# Patient Record
Sex: Female | Born: 1985 | Race: Asian | Hispanic: No | Marital: Married | Smoking: Never smoker
Health system: Southern US, Community
[De-identification: ages and names within clinical notes are randomized; demographics above are authoritative.]

## PROBLEM LIST (undated history)

## (undated) ENCOUNTER — Inpatient Hospital Stay (HOSPITAL_COMMUNITY): Payer: Self-pay

## (undated) DIAGNOSIS — Z789 Other specified health status: Secondary | ICD-10-CM

## (undated) DIAGNOSIS — O139 Gestational [pregnancy-induced] hypertension without significant proteinuria, unspecified trimester: Secondary | ICD-10-CM

## (undated) HISTORY — PX: ABDOMINOPLASTY: SUR9

## (undated) HISTORY — DX: Other specified health status: Z78.9

## (undated) HISTORY — PX: NO PAST SURGERIES: SHX2092

## (undated) HISTORY — DX: Gestational (pregnancy-induced) hypertension without significant proteinuria, unspecified trimester: O13.9

---
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
## 2006-04-20 ENCOUNTER — Other Ambulatory Visit: Admission: RE | Admit: 2006-04-20 | Discharge: 2006-04-20 | Payer: Self-pay | Admitting: Obstetrics and Gynecology

## 2006-05-22 ENCOUNTER — Ambulatory Visit (HOSPITAL_COMMUNITY): Admission: RE | Admit: 2006-05-22 | Discharge: 2006-05-22 | Payer: Self-pay | Admitting: Obstetrics and Gynecology

## 2006-07-12 ENCOUNTER — Inpatient Hospital Stay (HOSPITAL_COMMUNITY): Admission: AD | Admit: 2006-07-12 | Discharge: 2006-07-12 | Payer: Self-pay | Admitting: Obstetrics and Gynecology

## 2006-10-02 ENCOUNTER — Inpatient Hospital Stay (HOSPITAL_COMMUNITY): Admission: AD | Admit: 2006-10-02 | Discharge: 2006-10-02 | Payer: Self-pay | Admitting: Obstetrics and Gynecology

## 2006-10-12 ENCOUNTER — Inpatient Hospital Stay (HOSPITAL_COMMUNITY): Admission: AD | Admit: 2006-10-12 | Discharge: 2006-10-12 | Payer: Self-pay | Admitting: Obstetrics and Gynecology

## 2006-10-14 ENCOUNTER — Encounter (INDEPENDENT_AMBULATORY_CARE_PROVIDER_SITE_OTHER): Payer: Self-pay | Admitting: Specialist

## 2006-10-14 ENCOUNTER — Inpatient Hospital Stay (HOSPITAL_COMMUNITY): Admission: AD | Admit: 2006-10-14 | Discharge: 2006-10-16 | Payer: Self-pay | Admitting: Obstetrics and Gynecology

## 2008-11-22 ENCOUNTER — Emergency Department (HOSPITAL_COMMUNITY): Admission: EM | Admit: 2008-11-22 | Discharge: 2008-11-22 | Payer: Self-pay | Admitting: Emergency Medicine

## 2008-11-23 ENCOUNTER — Inpatient Hospital Stay (HOSPITAL_COMMUNITY): Admission: AD | Admit: 2008-11-23 | Discharge: 2008-11-23 | Payer: Self-pay | Admitting: Obstetrics & Gynecology

## 2008-11-30 ENCOUNTER — Inpatient Hospital Stay (HOSPITAL_COMMUNITY): Admission: AD | Admit: 2008-11-30 | Discharge: 2008-11-30 | Payer: Self-pay | Admitting: Obstetrics & Gynecology

## 2009-12-17 IMAGING — US US OB COMP LESS 14 WK
1 series · 14 of 28 positions shown · non-contrast
Comparison: none

OBSTETRICAL ULTRASOUND:
 This ultrasound exam was performed in the [HOSPITAL] Ultrasound Department.  The OB US report was generated in the AS system, and faxed to the ordering physician.  This report is also available in [REDACTED] PACS.

[Series 1: us ob comp less 14 wks · 35 acquisitions, 14 frames shown]
[im 2/35]
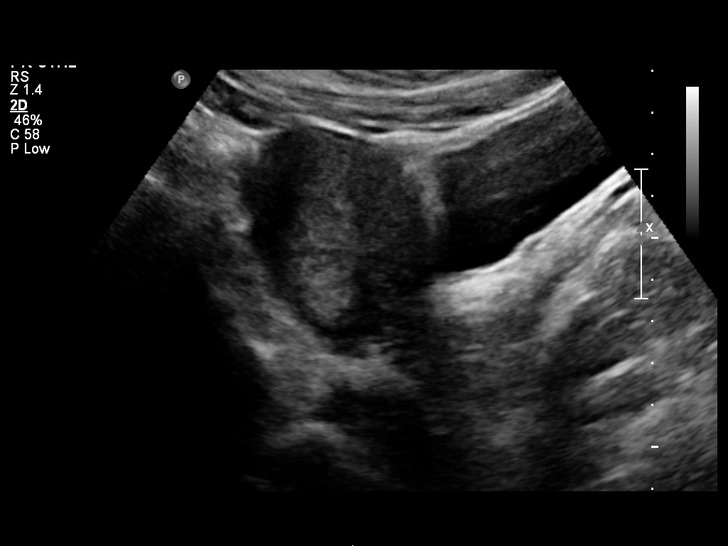
[im 4/35]
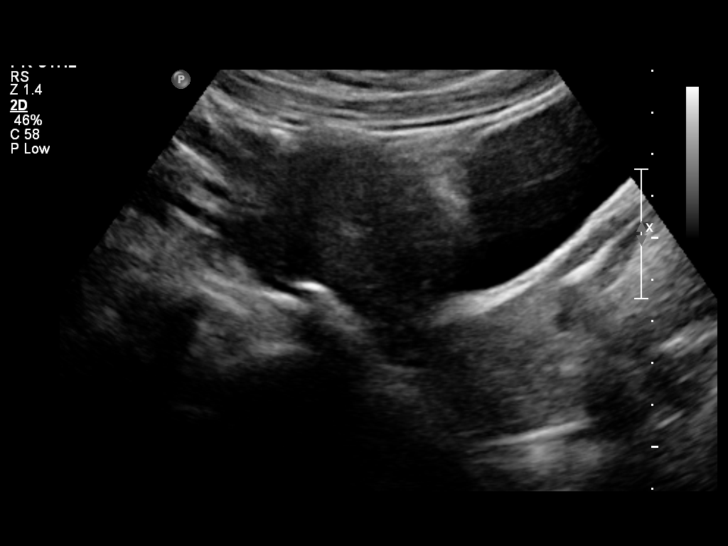
[im 7/35]
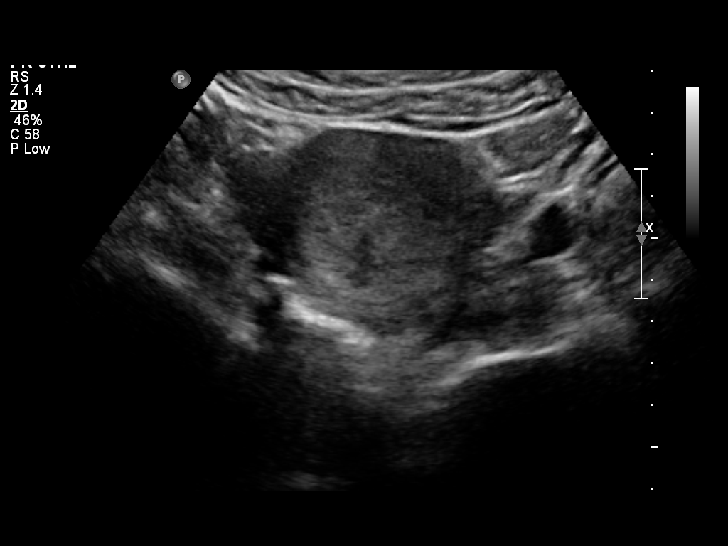
[im 9/35]
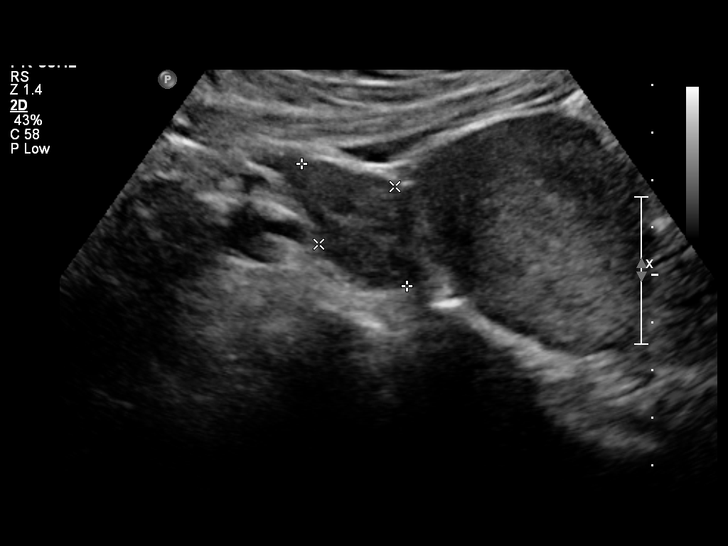
[im 12/35]
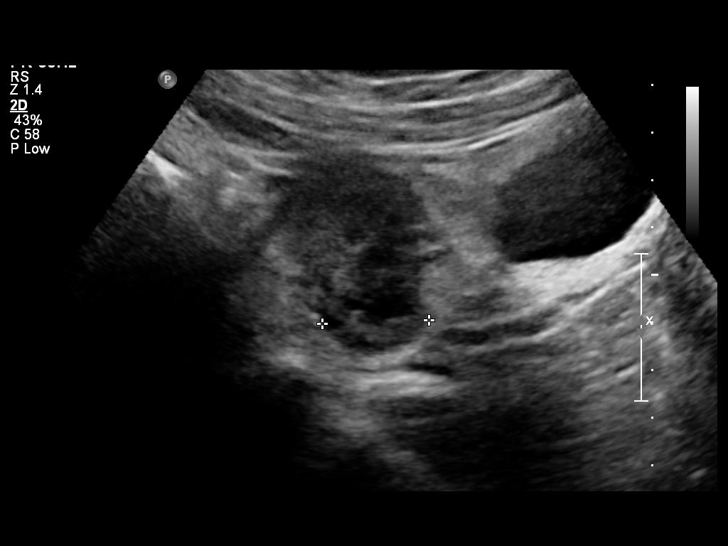
[im 14/35]
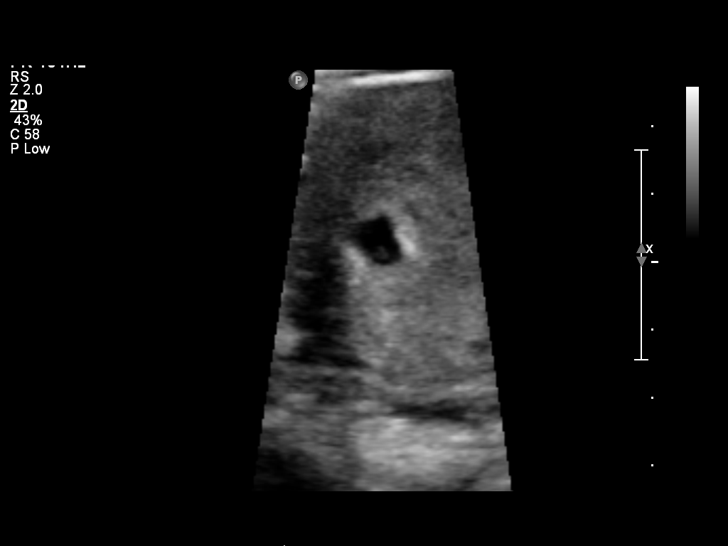
[im 17/35]
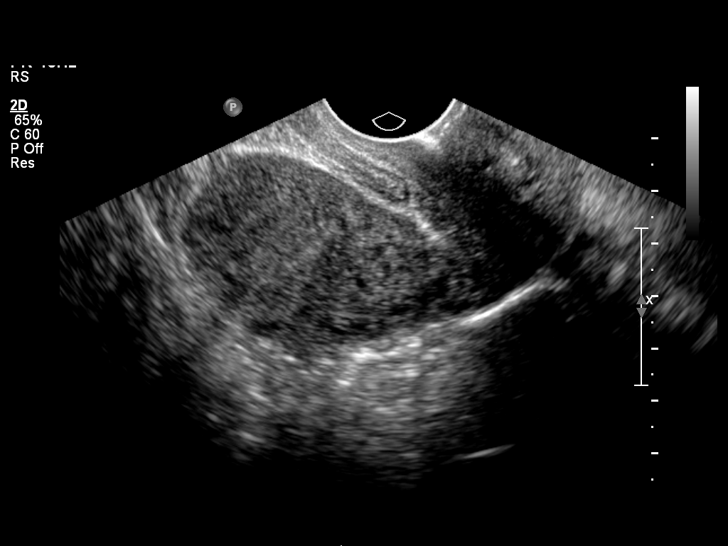
[im 19/35]
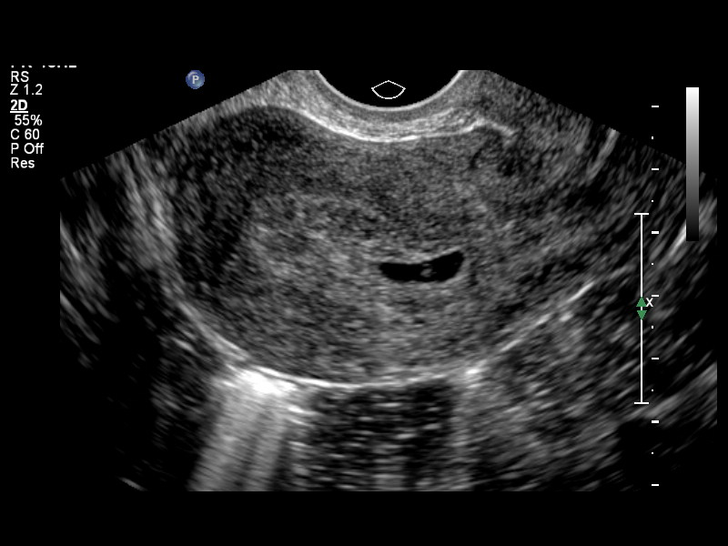
[im 22/35]
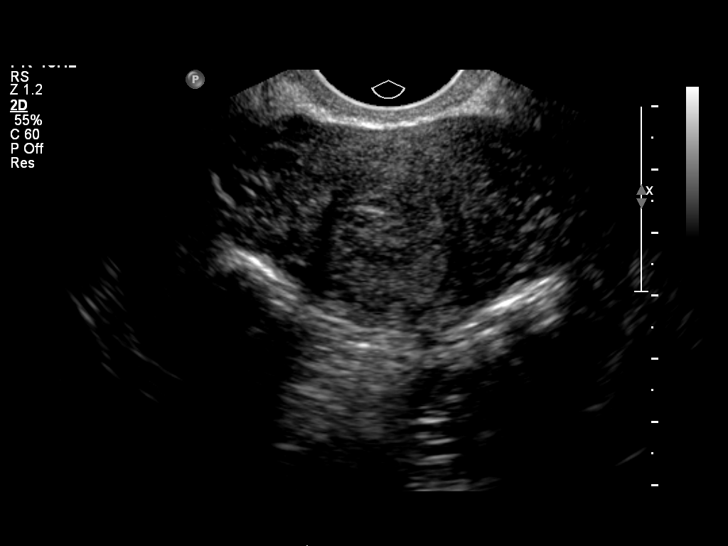
[im 24/35]
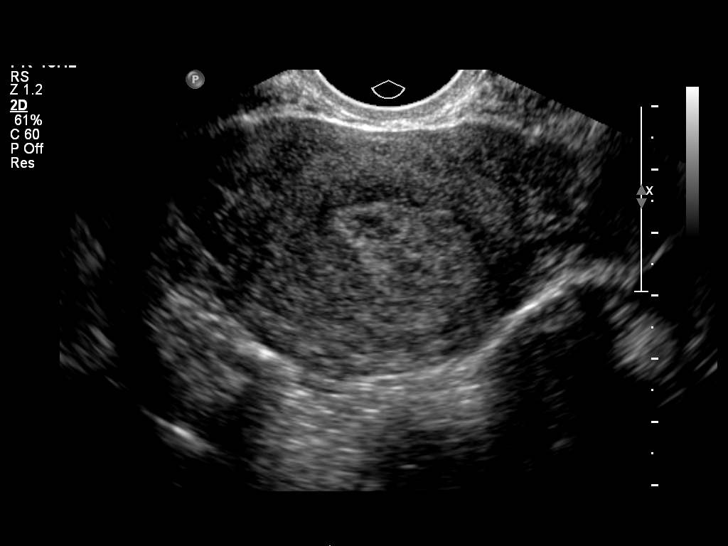
[im 27/35]
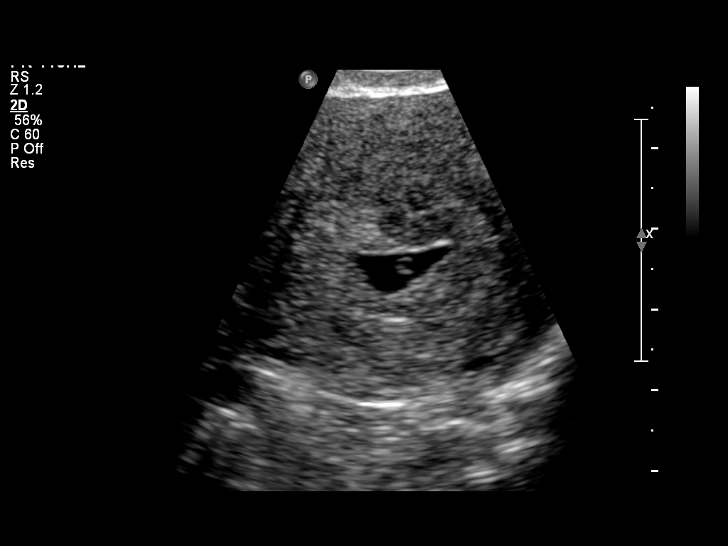
[im 29/35]
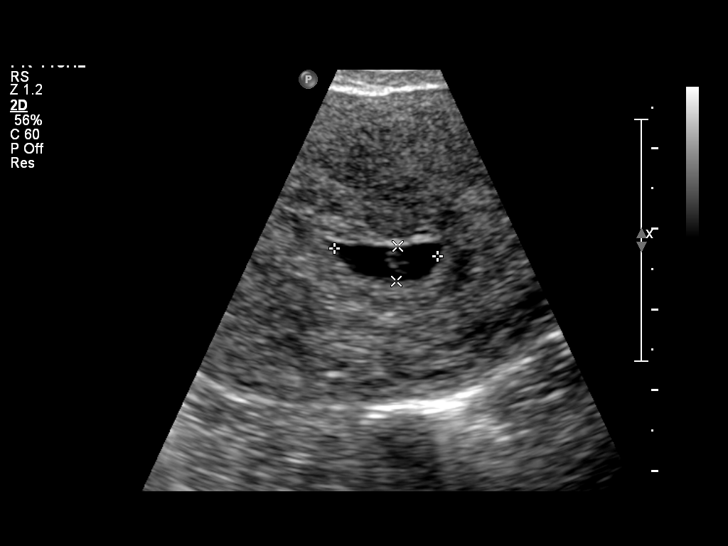
[im 32/35]
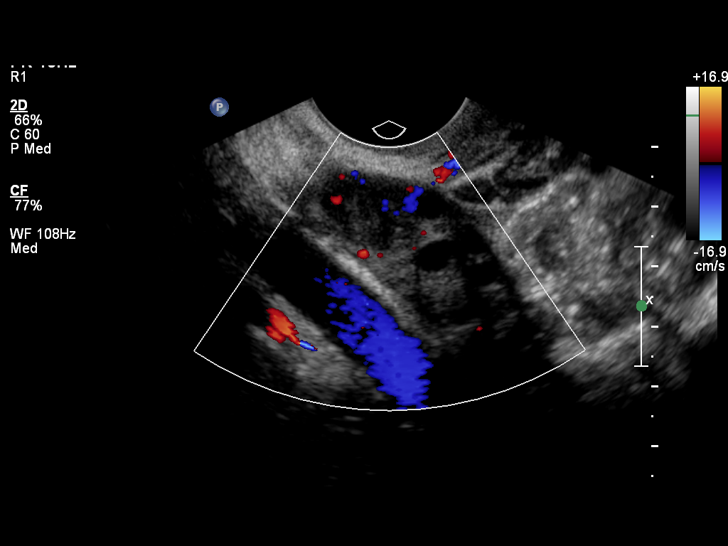
[im 35/35]
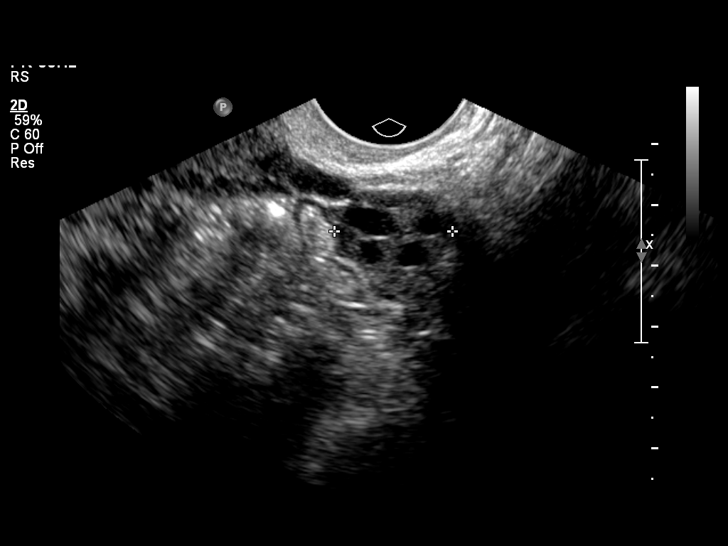

[14 of 28 positions shown; findings below may reference images not displayed]

IMPRESSION: See AS Obstetric US report.

## 2009-12-31 ENCOUNTER — Ambulatory Visit (HOSPITAL_COMMUNITY): Admission: RE | Admit: 2009-12-31 | Discharge: 2009-12-31 | Payer: Self-pay | Admitting: Obstetrics & Gynecology

## 2010-05-22 ENCOUNTER — Inpatient Hospital Stay (HOSPITAL_COMMUNITY): Admission: AD | Admit: 2010-05-22 | Discharge: 2010-05-24 | Payer: Self-pay | Admitting: Obstetrics

## 2010-11-17 NOTE — L&D Delivery Note (Signed)
Delivery Note Pt progressed rapidly to complete dilation.  At 8:09 AM a healthy female was delivered via Vaginal, Spontaneous Delivery (Presentation: LOA  ).  APGAR:8 ,9 ; weight 6 lb 14.1 oz (3120 g).   Placenta status: Intact, Spontaneous.  Cord: 3 vessels with the following complications: .  Tight nuchal delivered through.  Anesthesia: Epidural  Episiotomy: None Lacerations: None Suture Repair:n/a Est. Blood Loss (mL): 350cc  Mom to postpartum.  Baby to nursery-stable.  Jillian Obrien 10/18/2011, 8:24 AM

## 2011-02-02 LAB — CBC
HCT: 26.7 % — ABNORMAL LOW (ref 36.0–46.0)
HCT: 28.3 % — ABNORMAL LOW (ref 36.0–46.0)
Hemoglobin: 9.5 g/dL — ABNORMAL LOW (ref 12.0–15.0)
MCH: 26.3 pg (ref 26.0–34.0)
MCH: 26.3 pg (ref 26.0–34.0)
MCHC: 33.7 g/dL (ref 30.0–36.0)
MCV: 78.2 fL (ref 78.0–100.0)
MCV: 78.9 fL (ref 78.0–100.0)
Platelets: 152 10*3/uL (ref 150–400)
RDW: 15.5 % (ref 11.5–15.5)
RDW: 15.6 % — ABNORMAL HIGH (ref 11.5–15.5)

## 2011-03-03 LAB — URINE MICROSCOPIC-ADD ON

## 2011-03-03 LAB — URINALYSIS, ROUTINE W REFLEX MICROSCOPIC
Bilirubin Urine: NEGATIVE
Ketones, ur: NEGATIVE mg/dL
Nitrite: NEGATIVE
Urobilinogen, UA: 1 mg/dL (ref 0.0–1.0)

## 2011-03-03 LAB — CBC
HCT: 39.2 % (ref 36.0–46.0)
Hemoglobin: 13.3 g/dL (ref 12.0–15.0)
MCHC: 34 g/dL (ref 30.0–36.0)
Platelets: 253 10*3/uL (ref 150–400)
RDW: 12.7 % (ref 11.5–15.5)

## 2011-03-03 LAB — GC/CHLAMYDIA PROBE AMP, GENITAL
Chlamydia, DNA Probe: NEGATIVE
GC Probe Amp, Genital: NEGATIVE

## 2011-03-03 LAB — WET PREP, GENITAL
Clue Cells Wet Prep HPF POC: NONE SEEN
Yeast Wet Prep HPF POC: NONE SEEN

## 2011-04-04 NOTE — Discharge Summary (Signed)
NAMECHRYSTIAN, Jillian Obrien                  ACCOUNT NO.:  [id number]   MEDICAL RECORD NO.:  [id number]          PATIENT TYPE:  INP   LOCATION:  9123                          FACILITY:  [facility]   PHYSICIAN:  James A. Ashley Royalty, M.D.DATE OF BIRTH:  29-Jan-1986   DATE OF ADMISSION:  10/14/2006  DATE OF DISCHARGE:  10/16/2006                               DISCHARGE SUMMARY   DISCHARGE DIAGNOSIS:  1. Intrauterine pregnancy at term, delivered.  2. Term birth living child, vertex.   OPERATIONS AND PROCEDURES:  OB delivery with episiotomy and  episiorrhaphy.   CONSULTATIONS:  None.   DISCHARGE MEDICATIONS:  Motrin 600 mg.   HISTORY AND PHYSICAL:  This is a 25 year old gravida 2, para 1, at 40  weeks 0 days gestation.  Prenatal care was uncomplicated.  The patient  presented to maternity admissions complaining of contractions.  She was  admitted by Dr. Sydnee Cabal at or about 5:00 a.m.  I was called at  approximately 7:40 a.m. (20 minutes before being scheduled to assume  call coverage) and told the patient was in labor and delivery and ready  to deliver.  I arrived at approximately 8:05.  The patient was complete  and pushing.  For the remainder of the history and physical, please see  chart.   HOSPITAL COURSE:  The patient went on to deliver at 8:18 a.m.  The  infant was a 6 pound 10 ounce female, Apgars 9 at 1 minute and 9 at 5  minutes, sent to the newborn nursery.  The delivery was accomplished by  Dr. Sylvester Harder over a second degree midline episiotomy which was  repaired without difficulty.  The patient's postpartum course was  benign.  She was discharged on the second postpartum day afebrile and in  satisfactory condition.   DISPOSITION:  The patient is to return to [facility] and  Obstetrics in 4-6 weeks for postpartum evaluation.      James A. Ashley Royalty, M.D.  Electronically Signed     JAM/MEDQ  D:  12/02/2006  T:  12/02/2006  Job:  [id number]

## 2011-10-18 ENCOUNTER — Inpatient Hospital Stay (HOSPITAL_COMMUNITY)
Admission: AD | Admit: 2011-10-18 | Discharge: 2011-10-19 | DRG: 775 | Disposition: A | Discharge status: Home / Self Care | Payer: Medicaid Other | Source: Ambulatory Visit | Attending: Obstetrics and Gynecology | Admitting: Obstetrics and Gynecology

## 2011-10-18 ENCOUNTER — Inpatient Hospital Stay (HOSPITAL_COMMUNITY): Payer: Medicaid Other | Admitting: Anesthesiology

## 2011-10-18 ENCOUNTER — Encounter (HOSPITAL_COMMUNITY): Payer: Self-pay | Admitting: Anesthesiology

## 2011-10-18 ENCOUNTER — Encounter (HOSPITAL_COMMUNITY): Payer: Self-pay | Admitting: *Deleted

## 2011-10-18 HISTORY — DX: Other specified health status: Z78.9

## 2011-10-18 LAB — CBC
HCT: 25.6 % — ABNORMAL LOW (ref 36.0–46.0)
MCV: 78 fL (ref 78.0–100.0)
Platelets: 195 10*3/uL (ref 150–400)
RBC: 3.28 MIL/uL — ABNORMAL LOW (ref 3.87–5.11)
WBC: 9.1 10*3/uL (ref 4.0–10.5)

## 2011-10-18 LAB — HIV ANTIBODY (ROUTINE TESTING W REFLEX): HIV: NONREACTIVE

## 2011-10-18 LAB — STREP B DNA PROBE: GBS: NEGATIVE

## 2011-10-18 LAB — HEPATITIS B SURFACE ANTIGEN: Hepatitis B Surface Ag: NEGATIVE

## 2011-10-18 LAB — RPR: RPR: NONREACTIVE

## 2011-10-18 LAB — ANTIBODY SCREEN: Antibody Screen: NEGATIVE

## 2011-10-18 MED ORDER — TETANUS-DIPHTH-ACELL PERTUSSIS 5-2.5-18.5 LF-MCG/0.5 IM SUSP
0.5000 mL | Freq: Once | INTRAMUSCULAR | Status: AC
  Administered 2011-10-19: 0.5 mL via INTRAMUSCULAR
  Filled 2011-10-18: qty 0.5

## 2011-10-18 MED ORDER — OXYCODONE-ACETAMINOPHEN 5-325 MG PO TABS
1.0000 | ORAL_TABLET | ORAL | Status: DC | PRN
  Administered 2011-10-18: 1 via ORAL
  Filled 2011-10-18: qty 1

## 2011-10-18 MED ORDER — FENTANYL 2.5 MCG/ML BUPIVACAINE 1/10 % EPIDURAL INFUSION (WH - ANES)
INTRAMUSCULAR | Status: DC | PRN
  Administered 2011-10-18: 14 mL/h via EPIDURAL

## 2011-10-18 MED ORDER — DIPHENHYDRAMINE HCL 25 MG PO CAPS: 25.0000 mg | ORAL_CAPSULE | Freq: Four times a day (QID) | ORAL | Status: DC | PRN

## 2011-10-18 MED ORDER — PHENYLEPHRINE 40 MCG/ML (10ML) SYRINGE FOR IV PUSH (FOR BLOOD PRESSURE SUPPORT): 80.0000 ug | PREFILLED_SYRINGE | INTRAVENOUS | Status: DC | PRN

## 2011-10-18 MED ORDER — OXYTOCIN BOLUS FROM INFUSION
500.0000 mL | Freq: Once | INTRAVENOUS | Status: DC
  Filled 2011-10-18: qty 500

## 2011-10-18 MED ORDER — ACETAMINOPHEN 325 MG PO TABS: 650.0000 mg | ORAL_TABLET | ORAL | Status: DC | PRN

## 2011-10-18 MED ORDER — ONDANSETRON HCL 4 MG PO TABS: 4.0000 mg | ORAL_TABLET | ORAL | Status: DC | PRN

## 2011-10-18 MED ORDER — LACTATED RINGERS IV SOLN: 500.0000 mL | INTRAVENOUS | Status: DC | PRN

## 2011-10-18 MED ORDER — BENZOCAINE-MENTHOL 20-0.5 % EX AERO
INHALATION_SPRAY | CUTANEOUS | Status: AC
  Filled 2011-10-18: qty 56

## 2011-10-18 MED ORDER — TERBUTALINE SULFATE 1 MG/ML IJ SOLN: 0.2500 mg | Freq: Once | INTRAMUSCULAR | Status: DC | PRN

## 2011-10-18 MED ORDER — SENNOSIDES-DOCUSATE SODIUM 8.6-50 MG PO TABS
2.0000 | ORAL_TABLET | Freq: Every day | ORAL | Status: DC
  Administered 2011-10-18: 2 via ORAL

## 2011-10-18 MED ORDER — SIMETHICONE 80 MG PO CHEW: 80.0000 mg | CHEWABLE_TABLET | ORAL | Status: DC | PRN

## 2011-10-18 MED ORDER — PHENYLEPHRINE 40 MCG/ML (10ML) SYRINGE FOR IV PUSH (FOR BLOOD PRESSURE SUPPORT)
PREFILLED_SYRINGE | INTRAVENOUS | Status: AC
  Filled 2011-10-18: qty 5

## 2011-10-18 MED ORDER — BENZOCAINE-MENTHOL 20-0.5 % EX AERO
1.0000 "application " | INHALATION_SPRAY | CUTANEOUS | Status: DC | PRN
  Administered 2011-10-18: 1 via TOPICAL

## 2011-10-18 MED ORDER — LANOLIN HYDROUS EX OINT: TOPICAL_OINTMENT | CUTANEOUS | Status: DC | PRN

## 2011-10-18 MED ORDER — LACTATED RINGERS IV SOLN
INTRAVENOUS | Status: DC
  Administered 2011-10-18 (×2): 125 mL/h via INTRAVENOUS

## 2011-10-18 MED ORDER — LACTATED RINGERS IV SOLN
500.0000 mL | Freq: Once | INTRAVENOUS | Status: AC
  Administered 2011-10-18: 500 mL via INTRAVENOUS

## 2011-10-18 MED ORDER — DIPHENHYDRAMINE HCL 50 MG/ML IJ SOLN: 12.5000 mg | INTRAMUSCULAR | Status: DC | PRN

## 2011-10-18 MED ORDER — FENTANYL 2.5 MCG/ML BUPIVACAINE 1/10 % EPIDURAL INFUSION (WH - ANES)
INTRAMUSCULAR | Status: AC
  Filled 2011-10-18: qty 60

## 2011-10-18 MED ORDER — PRENATAL PLUS 27-1 MG PO TABS: 1.0000 | ORAL_TABLET | Freq: Every day | ORAL | Status: DC

## 2011-10-18 MED ORDER — ZOLPIDEM TARTRATE 5 MG PO TABS: 5.0000 mg | ORAL_TABLET | Freq: Every evening | ORAL | Status: DC | PRN

## 2011-10-18 MED ORDER — BENZOCAINE-MENTHOL 20-0.5 % EX AERO
INHALATION_SPRAY | CUTANEOUS | Status: AC
  Administered 2011-10-18: 18:00:00
  Filled 2011-10-18: qty 56

## 2011-10-18 MED ORDER — SODIUM BICARBONATE 8.4 % IV SOLN
INTRAVENOUS | Status: DC | PRN
  Administered 2011-10-18: 4 mL via EPIDURAL

## 2011-10-18 MED ORDER — EPHEDRINE 5 MG/ML INJ: 10.0000 mg | INTRAVENOUS | Status: DC | PRN

## 2011-10-18 MED ORDER — WITCH HAZEL-GLYCERIN EX PADS: 1.0000 "application " | MEDICATED_PAD | CUTANEOUS | Status: DC | PRN

## 2011-10-18 MED ORDER — IBUPROFEN 600 MG PO TABS
600.0000 mg | ORAL_TABLET | Freq: Four times a day (QID) | ORAL | Status: DC
  Administered 2011-10-18 – 2011-10-19 (×2): 600 mg via ORAL
  Filled 2011-10-18 (×3): qty 1

## 2011-10-18 MED ORDER — EPHEDRINE 5 MG/ML INJ
INTRAVENOUS | Status: AC
  Filled 2011-10-18: qty 4

## 2011-10-18 MED ORDER — IBUPROFEN 600 MG PO TABS: 600.0000 mg | ORAL_TABLET | Freq: Four times a day (QID) | ORAL | Status: DC | PRN

## 2011-10-18 MED ORDER — OXYCODONE-ACETAMINOPHEN 5-325 MG PO TABS: 2.0000 | ORAL_TABLET | ORAL | Status: DC | PRN

## 2011-10-18 MED ORDER — ONDANSETRON HCL 4 MG/2ML IJ SOLN: 4.0000 mg | INTRAMUSCULAR | Status: DC | PRN

## 2011-10-18 MED ORDER — OXYTOCIN 20 UNITS IN LACTATED RINGERS INFUSION - SIMPLE
1.0000 m[IU]/min | INTRAVENOUS | Status: DC
  Administered 2011-10-18: 2 m[IU]/min via INTRAVENOUS
  Filled 2011-10-18: qty 1000

## 2011-10-18 MED ORDER — FENTANYL 2.5 MCG/ML BUPIVACAINE 1/10 % EPIDURAL INFUSION (WH - ANES)
14.0000 mL/h | INTRAMUSCULAR | Status: DC
  Filled 2011-10-18: qty 60

## 2011-10-18 MED ORDER — ONDANSETRON HCL 4 MG/2ML IJ SOLN: 4.0000 mg | Freq: Four times a day (QID) | INTRAMUSCULAR | Status: DC | PRN

## 2011-10-18 MED ORDER — CITRIC ACID-SODIUM CITRATE 334-500 MG/5ML PO SOLN: 30.0000 mL | ORAL | Status: DC | PRN

## 2011-10-18 MED ORDER — DIBUCAINE 1 % RE OINT: 1.0000 "application " | TOPICAL_OINTMENT | RECTAL | Status: DC | PRN

## 2011-10-18 MED ORDER — OXYTOCIN 20 UNITS IN LACTATED RINGERS INFUSION - SIMPLE
125.0000 mL/h | Freq: Once | INTRAVENOUS | Status: AC
  Administered 2011-10-18: 125 mL/h via INTRAVENOUS

## 2011-10-18 MED ORDER — FLEET ENEMA 7-19 GM/118ML RE ENEM: 1.0000 | ENEMA | RECTAL | Status: DC | PRN

## 2011-10-18 MED ORDER — LIDOCAINE HCL (PF) 1 % IJ SOLN: 30.0000 mL | INTRAMUSCULAR | Status: DC | PRN

## 2011-10-18 NOTE — Anesthesia Procedure Notes (Signed)

## 2011-10-18 NOTE — Anesthesia Preprocedure Evaluation (Signed)
Anesthesia Evaluation  Patient identified by MRN, date of birth, ID band Patient awake    Reviewed: Allergy & Precautions, H&P , Patient's Chart, lab work & pertinent test results  Airway Mallampati: II TM Distance: >3 FB Neck ROM: full    Dental  (+) Teeth Intact   Pulmonary  clear to auscultation        Cardiovascular regular Normal    Neuro/Psych    GI/Hepatic   Endo/Other    Renal/GU      Musculoskeletal   Abdominal   Peds  Hematology  (+) Blood dyscrasia, anemia ,   Anesthesia Other Findings       Reproductive/Obstetrics (+) Pregnancy                           Anesthesia Physical Anesthesia Plan  ASA: II  Anesthesia Plan: Epidural   Post-op Pain Management:    Induction:   Airway Management Planned:   Additional Equipment:   Intra-op Plan:   Post-operative Plan:   Informed Consent: I have reviewed the patients History and Physical, chart, labs and discussed the procedure including the risks, benefits and alternatives for the proposed anesthesia with the patient or authorized representative who has indicated his/her understanding and acceptance.   Dental Advisory Given  Plan Discussed with:   Anesthesia Plan Comments: (Labs checked- platelets confirmed with RN in room. Fetal heart tracing, per RN, reported to be stable enough for sitting procedure. Discussed epidural, and patient consents to the procedure:  included risk of possible headache,backache, failed block, allergic reaction, and nerve injury. This patient was asked if she had any questions or concerns before the procedure started. )        Anesthesia Quick Evaluation  

## 2011-10-18 NOTE — Progress Notes (Signed)
Pt states, " I was at [facility] and felt a gush of water. It is still running out and I started having contractions fifteen minutes ago."

## 2011-10-18 NOTE — H&P (Signed)
Jillian Obrien is a 25 y.o. female [phone] at 37+ weeks (EDD 11/02/11 by 16 week [place]) presenting for SROM at midnight of clear fluid and irregular contractions.  Pt was admitted and requested epidural as she has never been able to receive one with her other labors because they went too fast.  Prenatal care is a little scanty beginning late at 23 weeks and pt missing appts in the beginning due to caring for her 51 month old who had a liver transplant.  Maternal Medical History:  Reason for admission: Reason for admission: rupture of membranes.  Contractions: Onset was 6-12 hours ago.   Frequency: irregular.   Perceived severity is moderate.    Fetal activity: Perceived fetal activity is normal.      OB History    Grav Para Term Preterm Abortions TAB SAB Ect Mult Living   5 3 3  1  1   3     2003 NSVD 6#7oz 2007 NSVD 6#10oz 2011 NSVD 6#15oz liver tx at 7 months 2010 SAB  Past Medical History  Diagnosis Date  . No pertinent past medical history    Past Surgical History  Procedure Date  . No past surgeries    Family History: family history is not on file. Social History:  reports that she has never smoked. She has never used smokeless tobacco. She reports that she does not drink alcohol or use illicit drugs.  ROS  Dilation: 4 Effacement (%): 80 Station: -2 Exam by:: Dr. Senaida Ores AROM forebag and IUPC placed to adjust pitocin  Blood pressure 105/46, pulse 79, temperature 98.4 F (36.9 C), temperature source Oral, resp. rate 16, height 5\' 1"  (1.549 m), weight 63.05 kg (139 lb), SpO2 100.00%. Maternal Exam:  Uterine Assessment: Contraction frequency is regular.   Abdomen: Fetal presentation: vertex  Introitus: Normal vulva. Normal vagina.    Physical Exam  Constitutional: She is oriented to person, place, and time. She appears well-developed and well-nourished.  Cardiovascular: Normal rate and regular rhythm.   Respiratory: Effort normal and breath sounds normal.  GI:  Soft. Bowel sounds are normal.  Genitourinary: Vagina normal and uterus normal.       50/3-4cm on admission  Neurological: She is alert and oriented to person, place, and time.  Psychiatric: She has a normal mood and affect. Her behavior is normal.    Prenatal labs: ABO, Rh:   Antibody: Negative (12/01 0000) Rubella: Immune (12/01 0000) RPR: Nonreactive (12/01 0000)  HBsAg: Negative (12/01 0000)  HIV: Non-reactive (12/01 0000)  GBS: Negative (12/01 0000) Too late to care for genetic screens One hour GTT WNL Assessment/Plan: Pt with uneventful prenatal course except for somewhat sporadic care.  Augmenting with pitocin.  Pt comfortable with epidural.  Huel Cote W 10/18/2011, 6:37 AM

## 2011-10-19 LAB — CBC
HCT: 24.5 % — ABNORMAL LOW (ref 36.0–46.0)
MCH: 24.1 pg — ABNORMAL LOW (ref 26.0–34.0)
MCHC: 30.6 g/dL (ref 30.0–36.0)
RDW: 15.4 % (ref 11.5–15.5)

## 2011-10-19 MED ORDER — IBUPROFEN 100 MG/5ML PO SUSP: 600.0000 mg | Freq: Four times a day (QID) | ORAL | Status: DC

## 2011-10-19 MED ORDER — COMPLETENATE 29-1 MG PO CHEW
1.0000 | CHEWABLE_TABLET | Freq: Every day | ORAL | Status: DC
  Administered 2011-10-19: 1 via ORAL
  Filled 2011-10-19 (×2): qty 1

## 2011-10-19 MED ORDER — IBUPROFEN 100 MG/5ML PO SUSP
600.0000 mg | Freq: Four times a day (QID) | ORAL | Status: DC
  Administered 2011-10-19: 600 mg via ORAL
  Filled 2011-10-19 (×6): qty 30

## 2011-10-19 NOTE — Discharge Summary (Signed)
Obstetric Discharge Summary Reason for Admission: onset of labor and rupture of membranes Prenatal Procedures: none Intrapartum Procedures: spontaneous vaginal delivery Postpartum Procedures: none Complications-Operative and Postpartum: none Hemoglobin  Date Value Range Status  10/19/2011 7.5* 12.0-15.0 (g/dL) Final     HCT  Date Value Range Status  10/19/2011 24.5* 36.0-46.0 (%) Final    Discharge Diagnoses: Term Pregnancy-delivered  Discharge Information: Date: 10/19/2011 Activity: pelvic rest Diet: routine Medications: Ibuprofen Condition: improved Instructions: refer to practice specific booklet Discharge to: home   Newborn Data: Live born female  Birth Weight: 6 lb 14.1 oz (3120 g) APGAR: 8, 9  Home with mother.  Oliver Pila 10/19/2011, 9:37 AM

## 2011-10-19 NOTE — Progress Notes (Signed)
Post Partum Day 1 Subjective: no complaints, up ad lib and tolerating PO, feels well and requesting early d/c  Objective: Blood pressure 92/59, pulse 77, temperature 97.8 F (36.6 C), temperature source Oral, resp. rate 18, height 5\' 1"  (1.549 m), weight 63.05 kg (139 lb), SpO2 100.00%, unknown if currently breastfeeding.  Physical Exam:  General: alert Lochia: appropriate Uterine Fundus: firm  Basename 10/19/11 0515 10/18/11 0159  HGB 7.5* 8.0*  HCT 24.5* 25.6*    Assessment/Plan: Discharge home if baby able to go Motrin F/u office 6 weeks   LOS: 1 day   Jillian Obrien W 10/19/2011, 9:34 AM

## 2011-10-20 NOTE — Progress Notes (Signed)
UR chart review completed.  

## 2013-09-05 ENCOUNTER — Encounter: Payer: Self-pay | Admitting: Obstetrics & Gynecology

## 2013-09-05 ENCOUNTER — Ambulatory Visit (INDEPENDENT_AMBULATORY_CARE_PROVIDER_SITE_OTHER): Payer: Medicaid Other | Admitting: Obstetrics & Gynecology

## 2013-09-05 VITALS — BP 118/77 | Temp 98.2°F | Wt 118.0 lb

## 2013-09-05 DIAGNOSIS — Z23 Encounter for immunization: Secondary | ICD-10-CM

## 2013-09-05 DIAGNOSIS — Z3201 Encounter for pregnancy test, result positive: Secondary | ICD-10-CM

## 2013-09-05 DIAGNOSIS — Z113 Encounter for screening for infections with a predominantly sexual mode of transmission: Secondary | ICD-10-CM

## 2013-09-05 DIAGNOSIS — O0940 Supervision of pregnancy with grand multiparity, unspecified trimester: Secondary | ICD-10-CM | POA: Insufficient documentation

## 2013-09-05 DIAGNOSIS — Z3481 Encounter for supervision of other normal pregnancy, first trimester: Secondary | ICD-10-CM

## 2013-09-05 LAB — POCT URINALYSIS DIPSTICK
Blood, UA: NEGATIVE
Ketones, UA: NEGATIVE
pH, UA: 5

## 2013-09-05 LAB — OB RESULTS CONSOLE GC/CHLAMYDIA
CHLAMYDIA, DNA PROBE: NEGATIVE
Gonorrhea: NEGATIVE

## 2013-09-05 LAB — POCT URINE PREGNANCY: Preg Test, Ur: POSITIVE

## 2013-09-05 MED ORDER — PRENATAL PLUS 27-1 MG PO TABS: 1.0000 | ORAL_TABLET | Freq: Every day | ORAL | Status: DC

## 2013-09-05 NOTE — Patient Instructions (Signed)
H1N1 Influenza (swine flu) Vaccine injection What is this medicine? H1N1 INFLUENZA (SWINE FLU) VACCINE (H1N1 in floo EN zuh (swahyn floo) vak SEEN) is a vaccine to protect from an infection with the pandemic H1N1 flu, also known as the swine flu. The vaccine only helps protect you against this one strain of the flu. This vaccine does not help to the reduce the risk of getting other types of flu. You may also need to get the seasonal influenza virus vaccine. This medicine may be used for other purposes; ask your health care provider or pharmacist if you have questions. What should I tell my health care provider before I take this medicine? They need to know if you have any of these conditions: -Guillain-Barre syndrome -immune system problems -an unusual or allergic reaction to influenza vaccine, eggs, neomycin, polymyxin, other medicines, foods, dyes or preservatives -pregnant or trying to get pregnant -breast-feeding How should I use this medicine? This vaccine is for injection into a muscle. It is given by a health care professional. A copy of Vaccine Information Statements will be given before each vaccination. Read this sheet carefully each time. The sheet may change frequently. Talk to your pediatrician regarding the use of this medicine in children. Special care may be needed. While this drug may be prescribed for children as young as 6 months for selected conditions, precautions do apply. Overdosage: If you think you've taken too much of this medicine contact a poison control center or emergency room at once. Overdosage: If you think you have taken too much of this medicine contact a poison control center or emergency room at once. NOTE: This medicine is only for you. Do not share this medicine with others. What if I miss a dose? If needed, keep appointments for follow-up (booster) doses as directed. It is important not to miss your dose. Call your doctor or health care professional if you  are unable to keep an appointment. What may interact with this medicine? -anakinra -medicines for organ transplant -medicines to treat cancer -other vaccines -rilonacept -steroid medicines like prednisone or cortisone -tumor necrosis factor (TNF) modifiers like adalimumab, etanercept, infliximab, golimumab, or certolizumab This list may not describe all possible interactions. Give your health care provider a list of all the medicines, herbs, non-prescription drugs, or dietary supplements you use. Also tell them if you smoke, drink alcohol, or use illegal drugs. Some items may interact with your medicine. What should I watch for while using this medicine? Report any side effects to your doctor right away. This vaccine lowers your risk of getting the pandemic H1N1 flu. You can get a milder H1N1 flu infection if you are around others with this flu. This flu vaccine will not protect against colds or other illnesses including other flu viruses. You may also need the seasonal influenza vaccine. What side effects may I notice from receiving this medicine? Side effects that you should report to your doctor or health care professional as soon as possible: -allergic reactions like skin rash, itching or hives, swelling of the face, lips, or tongue -breathing problems -muscle weakness -unusual drooping or paralysis of face Side effects that usually do not require medical attention (Report these to your doctor or health care professional if they continue or are bothersome.): -chills -cough -headache -muscle aches and pains -runny or stuffy nose -sore throat -stomach upset -tiredness This list may not describe all possible side effects. Call your doctor for medical advice about side effects. You may report side effects to FDA  at 1-800-FDA-1088. Where should I keep my medicine? This vaccine is only given in a clinic, pharmacy, doctor's office, or other health care setting and will not be stored at  home. NOTE: This sheet is a summary. It may not cover all possible information. If you have questions about this medicine, talk to your doctor, pharmacist, or health care provider.  2012, Elsevier/Gold Standard. (10/03/2008 4:49:51 PM)CF Gene Mutation Testing This is a test used to detect cystic fibrosis (CF) genetic mutations to establish CF carrier status or to establish the diagnosis of CF in an individual. The CF gene mutation test identifies mutations in the CFTR gene on chromosome 7. Each cell in the human body (except sperm and eggs) has 46 chromosomes (23 inherited from the mother and 23 from the father). Genes on these chromosomes form the body's blueprint for producing proteins that control body functions. Cystic fibrosis is caused by a mutation in a pair of genes located on chromosomes 7. Both copies of this gene must be abnormal to cause CF. If only one copy of the gene pair is mutated, the patient will be a carrier. Carriers are not ill, they do not have any symptoms, but they can pass their abnormal CF gene copy on to their children.  When a newborn infant has meconium ileus (no stools in the first 24 to 48 hours of life) or when a person has symptoms of CF (salty sweat, persistent respiratory infections, wheezing, persistent diarrhea, foul-smelling greasy stools, malnutrition, and vitamin deficiency); if a person has a positive sweat chloride or IRT test or a close relative who has been diagnosed with CF; when a patient is undergoing genetic counseling and wants to find out if they are a CF carrier; or for prenatal diagnosis. PREPARATION FOR TEST A blood sample drawn from an infant's heel; a spot of blood that is put onto filter paper; or a blood sample is drawn from a vein in the arm. NORMAL FINDINGS No genetic mutation. Ranges for normal findings may vary among different laboratories and hospitals. You should always check with your doctor after having lab work or other tests done to  discuss the meaning of your test results and whether your values are considered within normal limits. MEANING OF TEST Your caregiver will go over the test results with you and discuss the importance and meaning of your results, as well as treatment options and the need for additional tests if necessary. OBTAINING THE TEST RESULTS It is your responsibility to obtain your test results. Ask the lab or department performing the test when and how you will get your results. Document Released: 11/27/2004 Document Revised: 01/26/2012 Document Reviewed: 10/11/2008 [facility] Patient Information 2014 [place], [place].

## 2013-09-05 NOTE — Progress Notes (Signed)
P 67 Subjective:    Jillian Obrien is being seen today for her first obstetrical visit.  This is not a planned pregnancy. She is at Unknown gestation.  Relationship with FOB: significant other, living together. Patient does intend to breast feed. Pregnancy history fully reviewed.  Menstrual History: OB History   Grav Para Term Preterm Abortions TAB SAB Ect Mult Living   6 4 4  1  1   4       Menarche age: 27 Patient's last menstrual period was 07/01/2013.    The following portions of the patient's history were reviewed and updated as appropriate: allergies, current medications, past family history, past medical history, past social history, past surgical history and problem list.  Review of Systems Pertinent items are noted in HPI.    Objective:      Subjective:     Menstrual History: OB History   Grav Para Term Preterm Abortions TAB SAB Ect Mult Living   6 4 4  1  1   4        Patient's last menstrual period was 07/01/2013.    The following portions of the patient's history were reviewed and updated as appropriate: allergies, current medications, past family history, past medical history, past social history, past surgical history and problem list.  Review of Systems Pertinent items are noted in HPI.    Objective:    BP 118/77  Temp(Src) 98.2 F (36.8 C)  Wt 53.524 kg (118 lb)  BMI 22.31 kg/m2  LMP 07/01/2013 General Appearance:    Alert, cooperative, no distress, appears stated age  Head:    Normocephalic, without obvious abnormality, atraumatic  Eyes:    PERRL, conjunctiva/corneas clear, EOM's intact, fundi    benign, both eyes  Ears:    Normal TM's and external ear canals, both ears  Nose:   Nares normal, septum midline, mucosa normal, no drainage    or sinus tenderness  Throat:   Lips, mucosa, and tongue normal; teeth and gums normal  Neck:   Supple, symmetrical, trachea midline, no adenopathy;    thyroid:  no enlargement/tenderness/nodules; no carotid  bruit or JVD  Back:     Symmetric, no curvature, ROM normal, no CVA tenderness  Lungs:     Clear to auscultation bilaterally, respirations unlabored  Chest Wall:    No tenderness or deformity   Heart:    Regular rate and rhythm, S1 and S2 normal, no murmur, rub   or gallop  Breast Exam:    No tenderness, masses, or nipple abnormality  Abdomen:     Soft, non-tender, bowel sounds active all four quadrants,    no masses, no organomegaly  Genitalia:    Purulent vaginal discharge   Extremities:   Extremities normal, atraumatic, no cyanosis or edema  Pulses:   2+ and symmetric all extremities  Skin:   Skin color, texture, turgor normal, no rashes or lesions  Lymph nodes:   Cervical, supraclavicular, and axillary nodes normal  Neurologic:   CNII-XII intact, normal strength, sensation and reflexes    throughout   Informal pelvic U/S: IUP w/ CRL [redacted]w[redacted]d; cardiac activity   Assessment:   Early pregnant        Plan:    Initial labs drawn. Prenatal vitamins.  Counseling provided regarding continued use of seat belts, cessation of alcohol consumption, smoking or use of illicit drugs; infection precautions i.e., influenza/TDAP immunizations, toxoplasmosis,CMV, parvovirus, listeria and varicella; workplace safety, exercise during pregnancy; routine dental care, safe medications, sexual activity, hot  tubs, saunas, pools, travel, caffeine use, fish and methlymercury, potential toxins, hair treatments, varicose veins Weight gain recommendations reviewed: u normal weight/BMI 18.5 - 24.9--> gain 25 - 35 lbs Problem list reviewed and updated. She elects to proceed w/CF mutation/FIRST testing Amniocentesis discussed: not indicated. Follow up in 6 weeks. 50% of 20 min visit spent on counseling and coordination of care.

## 2013-09-06 ENCOUNTER — Other Ambulatory Visit: Payer: Self-pay | Admitting: Obstetrics & Gynecology

## 2013-09-06 DIAGNOSIS — Z3682 Encounter for antenatal screening for nuchal translucency: Secondary | ICD-10-CM

## 2013-09-06 LAB — WET PREP BY MOLECULAR PROBE
Candida species: NEGATIVE
Gardnerella vaginalis: POSITIVE — AB
Trichomonas vaginosis: NEGATIVE

## 2013-09-06 LAB — OBSTETRIC PANEL
Antibody Screen: NEGATIVE
Basophils Absolute: 0 10*3/uL (ref 0.0–0.1)
Basophils Relative: 0 % (ref 0–1)
Eosinophils Absolute: 0 10*3/uL (ref 0.0–0.7)
Eosinophils Relative: 0 % (ref 0–5)
HCT: 37.5 % (ref 36.0–46.0)
MCH: 30 pg (ref 26.0–34.0)
MCHC: 34.1 g/dL (ref 30.0–36.0)
Monocytes Absolute: 0.6 10*3/uL (ref 0.1–1.0)
Monocytes Relative: 7 % (ref 3–12)
Neutro Abs: 6.5 10*3/uL (ref 1.7–7.7)
RDW: 13.3 % (ref 11.5–15.5)
Rh Type: POSITIVE

## 2013-09-06 LAB — GC/CHLAMYDIA PROBE AMP
CT Probe RNA: NEGATIVE
GC Probe RNA: NEGATIVE

## 2013-09-06 LAB — HIV ANTIBODY (ROUTINE TESTING W REFLEX): HIV: NONREACTIVE

## 2013-09-07 LAB — CYSTIC FIBROSIS DIAGNOSTIC STUDY

## 2013-09-07 LAB — HEMOGLOBINOPATHY EVALUATION
Hgb A2 Quant: 2.6 % (ref 2.2–3.2)
Hgb A: 97.4 % (ref 96.8–97.8)
Hgb F Quant: 0 % (ref 0.0–2.0)

## 2013-09-09 ENCOUNTER — Other Ambulatory Visit: Payer: Self-pay | Admitting: *Deleted

## 2013-09-09 DIAGNOSIS — O0940 Supervision of pregnancy with grand multiparity, unspecified trimester: Secondary | ICD-10-CM

## 2013-09-13 ENCOUNTER — Encounter: Payer: Self-pay | Admitting: Obstetrics & Gynecology

## 2013-09-13 ENCOUNTER — Ambulatory Visit (INDEPENDENT_AMBULATORY_CARE_PROVIDER_SITE_OTHER): Payer: Medicaid Other

## 2013-09-13 DIAGNOSIS — Z3480 Encounter for supervision of other normal pregnancy, unspecified trimester: Secondary | ICD-10-CM

## 2013-09-13 DIAGNOSIS — O0940 Supervision of pregnancy with grand multiparity, unspecified trimester: Secondary | ICD-10-CM

## 2013-09-13 LAB — US OB COMP LESS 14 WKS

## 2013-09-16 ENCOUNTER — Encounter: Payer: Self-pay | Admitting: Obstetrics & Gynecology

## 2013-09-16 DIAGNOSIS — E559 Vitamin D deficiency, unspecified: Secondary | ICD-10-CM | POA: Insufficient documentation

## 2013-09-16 LAB — US OB DETAIL + 14 WK

## 2013-09-19 ENCOUNTER — Other Ambulatory Visit: Payer: Self-pay | Admitting: *Deleted

## 2013-09-19 DIAGNOSIS — E559 Vitamin D deficiency, unspecified: Secondary | ICD-10-CM

## 2013-09-19 MED ORDER — OB COMPLETE PETITE 35-5-1-200 MG PO CAPS: 1.0000 | ORAL_CAPSULE | Freq: Every day | ORAL | Status: DC

## 2013-09-26 ENCOUNTER — Ambulatory Visit (HOSPITAL_COMMUNITY)
Admission: RE | Admit: 2013-09-26 | Discharge: 2013-09-26 | Disposition: A | Discharge status: Home / Self Care | Payer: Medicaid Other | Source: Ambulatory Visit | Attending: Obstetrics & Gynecology | Admitting: Obstetrics & Gynecology

## 2013-09-26 ENCOUNTER — Other Ambulatory Visit: Payer: Self-pay

## 2013-09-26 DIAGNOSIS — O351XX0 Maternal care for (suspected) chromosomal abnormality in fetus, not applicable or unspecified: Secondary | ICD-10-CM | POA: Insufficient documentation

## 2013-09-26 DIAGNOSIS — E559 Vitamin D deficiency, unspecified: Secondary | ICD-10-CM

## 2013-09-26 DIAGNOSIS — Z3682 Encounter for antenatal screening for nuchal translucency: Secondary | ICD-10-CM

## 2013-09-26 DIAGNOSIS — Z3689 Encounter for other specified antenatal screening: Secondary | ICD-10-CM | POA: Insufficient documentation

## 2013-09-26 DIAGNOSIS — O3510X0 Maternal care for (suspected) chromosomal abnormality in fetus, unspecified, not applicable or unspecified: Secondary | ICD-10-CM | POA: Insufficient documentation

## 2013-09-26 DIAGNOSIS — O0940 Supervision of pregnancy with grand multiparity, unspecified trimester: Secondary | ICD-10-CM

## 2013-09-26 NOTE — Progress Notes (Signed)
Jillian Obrien  was seen today for an ultrasound appointment.  See full report in [facility]-OB/GYN.  Impression: Single IUP at 12 3/7 weeks Normal NT (1.4 mm).  Nasal bone visualized. First trimester aneuploidy screen performed as noted above.   Recommendations: Please do not draw triple/quad screen, though patient should be offered MSAFP for neural tube defect screening.  Recommend follow up ultrasound at approx 18 weeks for anatomy  Alpha Gula, MD

## 2013-09-27 NOTE — Addendum Note (Signed)
Encounter addended by: Alessandra Bevels. Chase Picket, RN on: 09/27/2013 11:33 AM<BR>     Documentation filed: Charges VN

## 2013-09-29 ENCOUNTER — Encounter: Payer: Self-pay | Admitting: Obstetrics & Gynecology

## 2013-10-04 ENCOUNTER — Encounter: Payer: Self-pay | Admitting: Obstetrics & Gynecology

## 2013-10-05 ENCOUNTER — Encounter: Payer: Self-pay | Admitting: Obstetrics & Gynecology

## 2013-10-17 ENCOUNTER — Ambulatory Visit (INDEPENDENT_AMBULATORY_CARE_PROVIDER_SITE_OTHER): Payer: Medicaid Other | Admitting: Obstetrics & Gynecology

## 2013-10-17 ENCOUNTER — Encounter: Payer: Self-pay | Admitting: Obstetrics & Gynecology

## 2013-10-17 VITALS — BP 124/76 | Temp 99.3°F | Wt 119.0 lb

## 2013-10-17 DIAGNOSIS — Z3480 Encounter for supervision of other normal pregnancy, unspecified trimester: Secondary | ICD-10-CM

## 2013-10-17 DIAGNOSIS — Z3481 Encounter for supervision of other normal pregnancy, first trimester: Secondary | ICD-10-CM

## 2013-10-17 LAB — POCT URINALYSIS DIPSTICK
Ketones, UA: NEGATIVE
Nitrite, UA: NEGATIVE
Urobilinogen, UA: NEGATIVE

## 2013-10-17 NOTE — Patient Instructions (Signed)

## 2013-10-17 NOTE — Progress Notes (Signed)
HR - 101 Pt in office today for routine OB visit, reports having constipation, pt given printed safe medication list

## 2013-11-08 ENCOUNTER — Ambulatory Visit (INDEPENDENT_AMBULATORY_CARE_PROVIDER_SITE_OTHER): Payer: Medicaid Other | Admitting: Advanced Practice Midwife

## 2013-11-08 ENCOUNTER — Encounter: Payer: Self-pay | Admitting: Advanced Practice Midwife

## 2013-11-08 ENCOUNTER — Ambulatory Visit (INDEPENDENT_AMBULATORY_CARE_PROVIDER_SITE_OTHER): Payer: Medicaid Other

## 2013-11-08 VITALS — BP 106/70 | Temp 98.7°F | Wt 123.0 lb

## 2013-11-08 DIAGNOSIS — Z3482 Encounter for supervision of other normal pregnancy, second trimester: Secondary | ICD-10-CM

## 2013-11-08 DIAGNOSIS — Z3480 Encounter for supervision of other normal pregnancy, unspecified trimester: Secondary | ICD-10-CM

## 2013-11-08 DIAGNOSIS — Z1389 Encounter for screening for other disorder: Secondary | ICD-10-CM

## 2013-11-08 DIAGNOSIS — O3680X1 Pregnancy with inconclusive fetal viability, fetus 1: Secondary | ICD-10-CM

## 2013-11-08 DIAGNOSIS — O365990 Maternal care for other known or suspected poor fetal growth, unspecified trimester, not applicable or unspecified: Secondary | ICD-10-CM

## 2013-11-08 LAB — POCT URINALYSIS DIPSTICK
Leukocytes, UA: NEGATIVE
Nitrite, UA: NEGATIVE
Urobilinogen, UA: NEGATIVE
pH, UA: 6

## 2013-11-08 LAB — US OB DETAIL + 14 WK

## 2013-11-08 NOTE — Progress Notes (Signed)
Patient doing well, no concerns. Expecting a boy. After visit reviewed [place] report, patient will need Repeat US for suboptimal views.  RTC in 4 weeks. Schedule 2 hour GCT at that visit. Ignatz Deis Wilson Singer CNM

## 2013-11-08 NOTE — Progress Notes (Signed)
HR - 91 Pt in office today for routine OB visit, denies any concerns at present

## 2013-11-14 ENCOUNTER — Encounter: Payer: Self-pay | Admitting: Obstetrics & Gynecology

## 2013-11-14 LAB — US OB DETAIL + 14 WK

## 2013-11-17 NOTE — L&D Delivery Note (Signed)
Delivery Note At 2:03 PM a viable female was delivered via Vaginal, Spontaneous Delivery (Presentation: ; Occiput Anterior).  APGAR: 8, 9; weight: 7 lb 11 oz .   Placenta status: Intact, delivered via Tomasa Blase,  meconium stained, sent to Pathology Spontaneous.  Cord:  with the following complications: None, 3 VC.  Cord pH: none  Anesthesia: Epidural  Episiotomy: None Lacerations: None Suture Repair: n/a Est. Blood Loss (mL): 200 ml  Mom to postpartum.  Baby to Couplet care / Skin to Skin.  Antionette Char 04/10/2014, 2:14 PM

## 2013-11-29 ENCOUNTER — Other Ambulatory Visit: Payer: Self-pay | Admitting: *Deleted

## 2013-11-29 ENCOUNTER — Other Ambulatory Visit: Payer: Medicaid Other

## 2013-11-29 DIAGNOSIS — Z1389 Encounter for screening for other disorder: Secondary | ICD-10-CM

## 2013-12-06 ENCOUNTER — Encounter: Payer: Medicaid Other | Admitting: Advanced Practice Midwife

## 2014-01-19 ENCOUNTER — Encounter: Payer: Self-pay | Admitting: Obstetrics

## 2014-01-23 ENCOUNTER — Encounter: Payer: Medicaid Other | Admitting: Obstetrics

## 2014-01-26 ENCOUNTER — Ambulatory Visit (INDEPENDENT_AMBULATORY_CARE_PROVIDER_SITE_OTHER): Payer: Medicaid Other | Admitting: Obstetrics

## 2014-01-26 ENCOUNTER — Encounter: Payer: Self-pay | Admitting: Obstetrics

## 2014-01-26 VITALS — BP 114/73 | Temp 97.3°F | Wt 134.0 lb

## 2014-01-26 DIAGNOSIS — Z3480 Encounter for supervision of other normal pregnancy, unspecified trimester: Secondary | ICD-10-CM

## 2014-01-26 LAB — POCT URINALYSIS DIPSTICK
BILIRUBIN UA: NEGATIVE
Blood, UA: NEGATIVE
Glucose, UA: NEGATIVE
Ketones, UA: NEGATIVE
LEUKOCYTES UA: NEGATIVE
Nitrite, UA: NEGATIVE
Protein, UA: NEGATIVE
Spec Grav, UA: 1.02
Urobilinogen, UA: NEGATIVE
pH, UA: 6

## 2014-01-26 NOTE — Addendum Note (Signed)
Addended by: Glendell DockerKNIGHT, Dani Wallner on: 01/26/2014 06:07 PM   Modules accepted: Orders

## 2014-01-26 NOTE — Progress Notes (Signed)
Pulse- 85 Pt states she is having lower abdomen pressure.

## 2014-02-09 ENCOUNTER — Encounter: Payer: Medicaid Other | Admitting: Obstetrics

## 2014-03-09 ENCOUNTER — Ambulatory Visit (INDEPENDENT_AMBULATORY_CARE_PROVIDER_SITE_OTHER): Payer: Medicaid Other | Admitting: Obstetrics

## 2014-03-09 VITALS — BP 117/76 | HR 90 | Temp 98.2°F | Wt 136.0 lb

## 2014-03-09 DIAGNOSIS — Z3480 Encounter for supervision of other normal pregnancy, unspecified trimester: Secondary | ICD-10-CM

## 2014-03-09 LAB — POCT URINALYSIS DIPSTICK
Bilirubin, UA: NEGATIVE
Blood, UA: NEGATIVE
GLUCOSE UA: NEGATIVE
Ketones, UA: NEGATIVE
NITRITE UA: NEGATIVE
Spec Grav, UA: 1.015
UROBILINOGEN UA: NEGATIVE
pH, UA: 6

## 2014-03-10 ENCOUNTER — Encounter: Payer: Self-pay | Admitting: Obstetrics

## 2014-03-10 NOTE — Progress Notes (Signed)
Subjective:    Jillian Obrien is a 28 y.o. female being seen today for her obstetrical visit. She is at 6119w0d gestation. Patient reports occasional contractions. Fetal movement: normal.   Past Medical History  Diagnosis Date  . No pertinent past medical history     Past Surgical History  Procedure Laterality Date  . No past surgeries      Current outpatient prescriptions:Prenat-FeCbn-FeAspGl-FA-Omega (OB COMPLETE PETITE) 35-5-1-200 MG CAPS, Take 1 capsule by mouth daily., Disp: 30 capsule, Rfl: 11 No Known Allergies  History  Substance Use Topics  . Smoking status: Never Smoker   . Smokeless tobacco: Never Used  . Alcohol Use: No    History reviewed. No pertinent family history.   Review of Systems Constitutional: negative for anorexia Gastrointestinal: negative for abdominal pain Genitourinary:negative for vaginal discharge Musculoskeletal:negative for back pain Behavioral/Psych: negative for depression and tobacco use   Objective:    BP 117/76  Pulse 90  Temp(Src) 98.2 F (36.8 C)  Wt 136 lb (61.689 kg)  LMP 07/01/2013 FHT:  150 BPM  Uterine Size: size equals dates  Presentation: unsure     Assessment:    Pregnancy @ 6119w0d weeks   Plan:     labs reviewed, problem list updated Consent signed. GBS sent TDAP offered  Rhogam given for RH negative Pediatrician: discussed. Infant feeding: plans to breastfeed. Maternity leave: discussed. Cigarette smoking: never smoked. Orders Placed This Encounter  Procedures  . POCT urinalysis dipstick   No orders of the defined types were placed in this encounter.   Follow up in 1 Week.

## 2014-03-16 ENCOUNTER — Encounter: Payer: Self-pay | Admitting: Obstetrics

## 2014-03-16 ENCOUNTER — Ambulatory Visit (INDEPENDENT_AMBULATORY_CARE_PROVIDER_SITE_OTHER): Payer: Medicaid Other | Admitting: Obstetrics

## 2014-03-16 VITALS — BP 109/73 | HR 99 | Temp 97.5°F | Wt 139.0 lb

## 2014-03-16 DIAGNOSIS — Z3480 Encounter for supervision of other normal pregnancy, unspecified trimester: Secondary | ICD-10-CM

## 2014-03-16 LAB — POCT URINALYSIS DIPSTICK
Bilirubin, UA: NEGATIVE
Glucose, UA: NEGATIVE
Ketones, UA: NEGATIVE
Nitrite, UA: NEGATIVE
PROTEIN UA: NEGATIVE
RBC UA: NEGATIVE
Spec Grav, UA: <=1.005
UROBILINOGEN UA: NEGATIVE
pH, UA: 7

## 2014-03-16 NOTE — Progress Notes (Signed)
Subjective:    Sherline Cyndie Chimeguyen is a 28 y.o. female being seen today for her obstetrical visit. She is at 3046w6d gestation. Patient reports no complaints. Fetal movement: normal.  Problem List Items Addressed This Visit   None    Visit Diagnoses   Supervision of other normal pregnancy    -  Primary    Relevant Orders       POCT urinalysis dipstick (Completed)       Strep B DNA probe      Patient Active Problem List   Diagnosis Date Noted  . Vitamin D deficiency 09/16/2013  . Supervision of pregnancy with grand multiparity 09/05/2013   Objective:    BP 109/73  Pulse 99  Temp(Src) 97.5 F (36.4 C)  Wt 139 lb (63.05 kg)  LMP 07/01/2013 FHT:  150 BPM  Uterine Size: size equals dates  Presentation: unsure     Assessment:    Pregnancy @ 2646w6d weeks   Plan:     labs reviewed, problem list updated Consent signed. GBS sent TDAP offered  Rhogam given for RH negative Pediatrician: discussed. Infant feeding: plans to breastfeed. Maternity leave: not discussed. Cigarette smoking: never smoked. Orders Placed This Encounter  Procedures  . Strep B DNA probe  . POCT urinalysis dipstick   No orders of the defined types were placed in this encounter.   Follow up in 1 Week.

## 2014-03-18 LAB — STREP B DNA PROBE: GBSP: NEGATIVE

## 2014-03-23 ENCOUNTER — Ambulatory Visit (INDEPENDENT_AMBULATORY_CARE_PROVIDER_SITE_OTHER): Payer: Medicaid Other | Admitting: Obstetrics

## 2014-03-23 ENCOUNTER — Encounter: Payer: Self-pay | Admitting: Obstetrics

## 2014-03-23 VITALS — BP 119/78 | HR 98 | Temp 98.2°F | Wt 139.0 lb

## 2014-03-23 DIAGNOSIS — Z3480 Encounter for supervision of other normal pregnancy, unspecified trimester: Secondary | ICD-10-CM

## 2014-03-23 DIAGNOSIS — K219 Gastro-esophageal reflux disease without esophagitis: Secondary | ICD-10-CM | POA: Insufficient documentation

## 2014-03-23 HISTORY — DX: Gastro-esophageal reflux disease without esophagitis: K21.9

## 2014-03-23 LAB — POCT URINALYSIS DIPSTICK
Glucose, UA: NEGATIVE
Ketones, UA: NEGATIVE
Nitrite, UA: NEGATIVE
PH UA: 5
Protein, UA: NEGATIVE
RBC UA: NEGATIVE
Spec Grav, UA: <=1.005

## 2014-03-23 MED ORDER — OMEPRAZOLE 20 MG PO CPDR: 20.0000 mg | DELAYED_RELEASE_CAPSULE | Freq: Every day | ORAL | Status: DC

## 2014-03-23 NOTE — Progress Notes (Signed)
Subjective:    Jillian Obrien is a 28 y.o. female being seen today for her obstetrical visit. She is at 2567w6d gestation. Patient reports no complaints. Fetal movement: normal.  Problem List Items Addressed This Visit   None    Visit Diagnoses   Supervision of other normal pregnancy    -  Primary    Relevant Orders       POCT urinalysis dipstick (Completed)    GERD without esophagitis        Relevant Medications       omeprazole (PRILOSEC) capsule      Patient Active Problem List   Diagnosis Date Noted  . Vitamin D deficiency 09/16/2013  . Supervision of pregnancy with grand multiparity 09/05/2013    Objective:    BP 119/78  Pulse 98  Temp(Src) 98.2 F (36.8 C)  Wt 139 lb (63.05 kg)  LMP 07/01/2013 FHT: 150 BPM  Uterine Size: size equals dates  Presentations: unsure  Pelvic Exam: Deferred                         Assessment:    Pregnancy @ 3467w6d weeks   Plan:   Plans for delivery: Vaginal anticipated; labs reviewed; problem list updated Counseling: Consent signed. Infant feeding: plans to breastfeed. Cigarette smoking: never smoked. L&D discussion: symptoms of labor, discussed when to call, discussed what number to call, anesthetic/analgesic options reviewed and delivering clinician:  plans Physician. Postpartum supports and preparation: circumcision discussed and contraception plans discussed.  Follow up in 1 Week.

## 2014-03-30 ENCOUNTER — Encounter: Payer: Self-pay | Admitting: Obstetrics

## 2014-03-30 ENCOUNTER — Ambulatory Visit (INDEPENDENT_AMBULATORY_CARE_PROVIDER_SITE_OTHER): Payer: Medicaid Other | Admitting: Obstetrics

## 2014-03-30 VITALS — BP 118/73 | HR 84 | Temp 97.9°F | Wt 140.0 lb

## 2014-03-30 DIAGNOSIS — Z3480 Encounter for supervision of other normal pregnancy, unspecified trimester: Secondary | ICD-10-CM

## 2014-03-30 NOTE — Progress Notes (Signed)
Subjective:    Jillian Obrien is a 28 y.o. female being seen today for her obstetrical visit. She is at 1233w6d gestation. Patient reports no complaints. Fetal movement: normal.  Problem List Items Addressed This Visit   None    Visit Diagnoses   Supervision of other normal pregnancy    -  Primary    Relevant Orders       POCT urinalysis dipstick      Patient Active Problem List   Diagnosis Date Noted  . GERD without esophagitis 03/23/2014  . Vitamin D deficiency 09/16/2013  . Supervision of pregnancy with grand multiparity 09/05/2013    Objective:    BP 118/73  Pulse 84  Temp(Src) 97.9 F (36.6 C)  Wt 140 lb (63.504 kg)  LMP 07/01/2013 FHT: 150 BPM  Uterine Size: size equals dates  Presentations: cephalic  Pelvic Exam:              Dilation: 1cm       Effacement: 50%             Station:  -2    Consistency: soft            Position: middle     Assessment:    Pregnancy @ 2833w6d weeks   Plan:   Plans for delivery: Vaginal anticipated; labs reviewed; problem list updated Counseling: Consent signed. Infant feeding: plans to breastfeed. Cigarette smoking: never smoked. L&D discussion: symptoms of labor, discussed when to call, discussed what number to call, anesthetic/analgesic options reviewed and delivering clinician:  plans Physician. Postpartum supports and preparation: circumcision discussed and contraception plans discussed.  Follow up in 1 Week.

## 2014-04-06 ENCOUNTER — Ambulatory Visit (INDEPENDENT_AMBULATORY_CARE_PROVIDER_SITE_OTHER): Payer: Medicaid Other | Admitting: Obstetrics

## 2014-04-06 ENCOUNTER — Encounter: Payer: Self-pay | Admitting: Obstetrics

## 2014-04-06 VITALS — BP 108/72 | HR 89 | Temp 98.4°F | Wt 143.0 lb

## 2014-04-06 DIAGNOSIS — Z3480 Encounter for supervision of other normal pregnancy, unspecified trimester: Secondary | ICD-10-CM

## 2014-04-06 LAB — POCT URINALYSIS DIPSTICK
Bilirubin, UA: NEGATIVE
GLUCOSE UA: NEGATIVE
KETONES UA: NEGATIVE
Nitrite, UA: NEGATIVE
SPEC GRAV UA: 1.01
Urobilinogen, UA: NEGATIVE
pH, UA: 7

## 2014-04-06 NOTE — Progress Notes (Signed)
Subjective:    Jillian Obrien is a 28 y.o. female being seen today for her obstetrical visit. She is at 8155w6d gestation. Patient reports no complaints. Fetal movement: normal.  Problem List Items Addressed This Visit   None    Visit Diagnoses   Supervision of other normal pregnancy    -  Primary    Relevant Orders       POCT urinalysis dipstick      Patient Active Problem List   Diagnosis Date Noted  . GERD without esophagitis 03/23/2014  . Vitamin D deficiency 09/16/2013  . Supervision of pregnancy with grand multiparity 09/05/2013    Objective:    BP 108/72  Pulse 89  Temp(Src) 98.4 F (36.9 C)  Wt 143 lb (64.864 kg)  LMP 07/01/2013 FHT: 150 BPM  Uterine Size: size equals dates  Presentations: cephalic  Pelvic Exam:              Dilation: 2cm       Effacement: 50%             Station:  -3    Consistency: soft            Position: posterior     Assessment:    Pregnancy @ 6355w6d weeks   Plan:   Plans for delivery: Vaginal anticipated; labs reviewed; problem list updated Counseling: Consent signed. Infant feeding: plans to breastfeed. Cigarette smoking: never smoked. L&D discussion: symptoms of labor, discussed when to call, discussed what number to call, anesthetic/analgesic options reviewed and delivering clinician:  plans Physician. Postpartum supports and preparation: circumcision discussed and contraception plans discussed.  Follow up in 1 Week.

## 2014-04-10 ENCOUNTER — Inpatient Hospital Stay (HOSPITAL_COMMUNITY): Payer: Medicaid Other | Admitting: Anesthesiology

## 2014-04-10 ENCOUNTER — Encounter (HOSPITAL_COMMUNITY): Payer: Medicaid Other | Admitting: Anesthesiology

## 2014-04-10 ENCOUNTER — Inpatient Hospital Stay (HOSPITAL_COMMUNITY)
Admission: AD | Admit: 2014-04-10 | Discharge: 2014-04-12 | DRG: 775 | Disposition: A | Discharge status: Home / Self Care | Payer: Medicaid Other | Source: Ambulatory Visit | Attending: Obstetrics & Gynecology | Admitting: Obstetrics & Gynecology

## 2014-04-10 ENCOUNTER — Encounter (HOSPITAL_COMMUNITY): Payer: Self-pay | Admitting: *Deleted

## 2014-04-10 DIAGNOSIS — O0940 Supervision of pregnancy with grand multiparity, unspecified trimester: Secondary | ICD-10-CM

## 2014-04-10 DIAGNOSIS — O9989 Other specified diseases and conditions complicating pregnancy, childbirth and the puerperium: Principal | ICD-10-CM

## 2014-04-10 DIAGNOSIS — O99892 Other specified diseases and conditions complicating childbirth: Principal | ICD-10-CM | POA: Diagnosis present

## 2014-04-10 DIAGNOSIS — E559 Vitamin D deficiency, unspecified: Secondary | ICD-10-CM

## 2014-04-10 DIAGNOSIS — K219 Gastro-esophageal reflux disease without esophagitis: Secondary | ICD-10-CM | POA: Diagnosis present

## 2014-04-10 LAB — CBC
HEMATOCRIT: 33.2 % — AB (ref 36.0–46.0)
Hemoglobin: 10.8 g/dL — ABNORMAL LOW (ref 12.0–15.0)
MCH: 26.5 pg (ref 26.0–34.0)
MCHC: 32.5 g/dL (ref 30.0–36.0)
MCV: 81.4 fL (ref 78.0–100.0)
Platelets: 198 10*3/uL (ref 150–400)
RBC: 4.08 MIL/uL (ref 3.87–5.11)
RDW: 14.3 % (ref 11.5–15.5)
WBC: 8.2 10*3/uL (ref 4.0–10.5)

## 2014-04-10 LAB — RPR

## 2014-04-10 LAB — TYPE AND SCREEN
ABO/RH(D): A POS
Antibody Screen: NEGATIVE

## 2014-04-10 MED ORDER — CITRIC ACID-SODIUM CITRATE 334-500 MG/5ML PO SOLN: 30.0000 mL | ORAL | Status: DC | PRN

## 2014-04-10 MED ORDER — PRENATAL MULTIVITAMIN CH
1.0000 | ORAL_TABLET | Freq: Every day | ORAL | Status: DC
  Filled 2014-04-10: qty 1

## 2014-04-10 MED ORDER — PHENYLEPHRINE 40 MCG/ML (10ML) SYRINGE FOR IV PUSH (FOR BLOOD PRESSURE SUPPORT)
80.0000 ug | PREFILLED_SYRINGE | INTRAVENOUS | Status: DC | PRN
  Filled 2014-04-10: qty 2
  Filled 2014-04-10: qty 10

## 2014-04-10 MED ORDER — OXYCODONE-ACETAMINOPHEN 5-325 MG PO TABS
1.0000 | ORAL_TABLET | ORAL | Status: DC | PRN
  Administered 2014-04-10: 1 via ORAL
  Filled 2014-04-10: qty 1

## 2014-04-10 MED ORDER — MAGNESIUM HYDROXIDE 400 MG/5ML PO SUSP: 30.0000 mL | ORAL | Status: DC | PRN

## 2014-04-10 MED ORDER — ONDANSETRON HCL 4 MG/2ML IJ SOLN: 4.0000 mg | Freq: Four times a day (QID) | INTRAMUSCULAR | Status: DC | PRN

## 2014-04-10 MED ORDER — PHENYLEPHRINE 40 MCG/ML (10ML) SYRINGE FOR IV PUSH (FOR BLOOD PRESSURE SUPPORT)
80.0000 ug | PREFILLED_SYRINGE | INTRAVENOUS | Status: DC | PRN
  Filled 2014-04-10: qty 2

## 2014-04-10 MED ORDER — LIDOCAINE HCL (PF) 1 % IJ SOLN
INTRAMUSCULAR | Status: DC | PRN
  Administered 2014-04-10 (×2): 5 mL

## 2014-04-10 MED ORDER — LACTATED RINGERS IV SOLN: 500.0000 mL | INTRAVENOUS | Status: DC | PRN

## 2014-04-10 MED ORDER — FENTANYL 2.5 MCG/ML BUPIVACAINE 1/10 % EPIDURAL INFUSION (WH - ANES)
14.0000 mL/h | INTRAMUSCULAR | Status: DC | PRN
  Administered 2014-04-10: 14 mL/h via EPIDURAL
  Filled 2014-04-10: qty 125

## 2014-04-10 MED ORDER — EPHEDRINE 5 MG/ML INJ
10.0000 mg | INTRAVENOUS | Status: DC | PRN
  Filled 2014-04-10: qty 2

## 2014-04-10 MED ORDER — OXYCODONE HCL 5 MG/5ML PO SOLN
5.0000 mg | ORAL | Status: DC | PRN
  Administered 2014-04-11 – 2014-04-12 (×5): 5 mg via ORAL
  Filled 2014-04-10 (×5): qty 5

## 2014-04-10 MED ORDER — EPHEDRINE 5 MG/ML INJ
10.0000 mg | INTRAVENOUS | Status: DC | PRN
  Filled 2014-04-10: qty 2
  Filled 2014-04-10: qty 4

## 2014-04-10 MED ORDER — FERROUS SULFATE 325 (65 FE) MG PO TABS
325.0000 mg | ORAL_TABLET | Freq: Two times a day (BID) | ORAL | Status: DC
  Administered 2014-04-11 – 2014-04-12 (×3): 325 mg via ORAL
  Filled 2014-04-10 (×3): qty 1

## 2014-04-10 MED ORDER — LACTATED RINGERS IV SOLN
INTRAVENOUS | Status: DC
  Administered 2014-04-10 (×2): via INTRAVENOUS

## 2014-04-10 MED ORDER — TETANUS-DIPHTH-ACELL PERTUSSIS 5-2.5-18.5 LF-MCG/0.5 IM SUSP: 0.5000 mL | Freq: Once | INTRAMUSCULAR | Status: DC

## 2014-04-10 MED ORDER — LANOLIN HYDROUS EX OINT
TOPICAL_OINTMENT | CUTANEOUS | Status: DC | PRN
Start: 2014-04-10 — End: 2014-04-12

## 2014-04-10 MED ORDER — OXYTOCIN 40 UNITS IN LACTATED RINGERS INFUSION - SIMPLE MED
1.0000 m[IU]/min | INTRAVENOUS | Status: DC
  Administered 2014-04-10: 666 m[IU]/min via INTRAVENOUS
  Administered 2014-04-10: 2 m[IU]/min via INTRAVENOUS

## 2014-04-10 MED ORDER — ZOLPIDEM TARTRATE 5 MG PO TABS
5.0000 mg | ORAL_TABLET | Freq: Every evening | ORAL | Status: DC | PRN
Start: 2014-04-10 — End: 2014-04-12

## 2014-04-10 MED ORDER — OXYTOCIN 40 UNITS IN LACTATED RINGERS INFUSION - SIMPLE MED
62.5000 mL/h | INTRAVENOUS | Status: DC
  Filled 2014-04-10: qty 1000

## 2014-04-10 MED ORDER — BENZOCAINE-MENTHOL 20-0.5 % EX AERO
1.0000 "application " | INHALATION_SPRAY | CUTANEOUS | Status: DC | PRN
  Administered 2014-04-10: 1 via TOPICAL
  Filled 2014-04-10: qty 56

## 2014-04-10 MED ORDER — WITCH HAZEL-GLYCERIN EX PADS
1.0000 "application " | MEDICATED_PAD | CUTANEOUS | Status: DC | PRN
  Administered 2014-04-12: 1 via TOPICAL

## 2014-04-10 MED ORDER — OXYCODONE-ACETAMINOPHEN 5-325 MG PO TABS: 1.0000 | ORAL_TABLET | ORAL | Status: DC | PRN

## 2014-04-10 MED ORDER — LIDOCAINE HCL (PF) 1 % IJ SOLN
30.0000 mL | INTRAMUSCULAR | Status: DC | PRN
  Filled 2014-04-10: qty 30

## 2014-04-10 MED ORDER — LACTATED RINGERS IV SOLN: 500.0000 mL | Freq: Once | INTRAVENOUS | Status: DC

## 2014-04-10 MED ORDER — OXYTOCIN BOLUS FROM INFUSION
500.0000 mL | INTRAVENOUS | Status: DC
Start: 2014-04-10 — End: 2014-04-10

## 2014-04-10 MED ORDER — IBUPROFEN 100 MG/5ML PO SUSP
600.0000 mg | Freq: Four times a day (QID) | ORAL | Status: DC | PRN
  Administered 2014-04-10: 600 mg via ORAL
  Filled 2014-04-10: qty 30

## 2014-04-10 MED ORDER — IBUPROFEN 600 MG PO TABS: 600.0000 mg | ORAL_TABLET | Freq: Four times a day (QID) | ORAL | Status: DC

## 2014-04-10 MED ORDER — TERBUTALINE SULFATE 1 MG/ML IJ SOLN: 0.2500 mg | Freq: Once | INTRAMUSCULAR | Status: DC | PRN

## 2014-04-10 MED ORDER — SENNOSIDES-DOCUSATE SODIUM 8.6-50 MG PO TABS
2.0000 | ORAL_TABLET | ORAL | Status: DC
  Administered 2014-04-10: 2 via ORAL
  Filled 2014-04-10 (×2): qty 2

## 2014-04-10 MED ORDER — ACETAMINOPHEN 325 MG PO TABS: 650.0000 mg | ORAL_TABLET | ORAL | Status: DC | PRN

## 2014-04-10 MED ORDER — DIPHENHYDRAMINE HCL 50 MG/ML IJ SOLN: 12.5000 mg | INTRAMUSCULAR | Status: DC | PRN

## 2014-04-10 MED ORDER — FLEET ENEMA 7-19 GM/118ML RE ENEM: 1.0000 | ENEMA | RECTAL | Status: DC | PRN

## 2014-04-10 MED ORDER — ACETAMINOPHEN 160 MG/5ML PO SOLN: 325.0000 mg | ORAL | Status: DC | PRN

## 2014-04-10 MED ORDER — IBUPROFEN 100 MG/5ML PO SUSP
600.0000 mg | Freq: Four times a day (QID) | ORAL | Status: DC
  Administered 2014-04-10 – 2014-04-12 (×6): 600 mg via ORAL
  Filled 2014-04-10 (×12): qty 30

## 2014-04-10 MED ORDER — DIPHENHYDRAMINE HCL 25 MG PO CAPS: 25.0000 mg | ORAL_CAPSULE | Freq: Four times a day (QID) | ORAL | Status: DC | PRN

## 2014-04-10 MED ORDER — IBUPROFEN 600 MG PO TABS
600.0000 mg | ORAL_TABLET | Freq: Four times a day (QID) | ORAL | Status: DC | PRN
  Filled 2014-04-10: qty 1

## 2014-04-10 MED ORDER — MEASLES, MUMPS & RUBELLA VAC ~~LOC~~ INJ: 0.5000 mL | INJECTION | Freq: Once | SUBCUTANEOUS | Status: DC

## 2014-04-10 MED ORDER — ONDANSETRON HCL 4 MG PO TABS
4.0000 mg | ORAL_TABLET | ORAL | Status: DC | PRN
Start: 2014-04-10 — End: 2014-04-12

## 2014-04-10 MED ORDER — DIBUCAINE 1 % RE OINT
1.0000 "application " | TOPICAL_OINTMENT | RECTAL | Status: DC | PRN
  Administered 2014-04-12: 1 via RECTAL
  Filled 2014-04-10: qty 28

## 2014-04-10 MED ORDER — ONDANSETRON HCL 4 MG/2ML IJ SOLN: 4.0000 mg | INTRAMUSCULAR | Status: DC | PRN

## 2014-04-10 NOTE — MAU Note (Signed)
UC's since this AM. No bleeding or leaking.

## 2014-04-10 NOTE — Anesthesia Postprocedure Evaluation (Signed)
Anesthesia Post Note  Patient: Jillian Obrien  Procedure(s) Performed: * No procedures listed *  Anesthesia type: Epidural  Patient location: Mother/Baby  Post pain: Pain level controlled  Post assessment: Post-op Vital signs reviewed  Last Vitals:  Filed Vitals:   04/10/14 1700  BP: 105/58  Pulse: 73  Temp: 36.8 C  Resp: 20    Post vital signs: Reviewed  Level of consciousness:alert  Complications: No apparent anesthesia complications

## 2014-04-10 NOTE — H&P (Signed)
Jillian Obrien is a 28 y.o. female presenting with contractions. Maternal Medical History:  Reason for admission: Contractions.   Contractions: Onset was 6-12 hours ago.   Frequency: regular.   Perceived severity is strong.    Fetal activity: Perceived fetal activity is normal.    Prenatal complications: no prenatal complications Prenatal Complications - Diabetes: none.    OB History   Grav Para Term Preterm Abortions TAB SAB Ect Mult Living   6 4 4  1  1   4      Past Medical History  Diagnosis Date  . No pertinent past medical history    Past Surgical History  Procedure Laterality Date  . No past surgeries     Family History: family history is not on file. Social History:  reports that she has never smoked. She has never used smokeless tobacco. She reports that she does not drink alcohol or use illicit drugs.     Review of Systems  Constitutional: Negative for fever.  Eyes: Negative for blurred vision.  Respiratory: Negative for shortness of breath.   Gastrointestinal: Negative for vomiting.  Skin: Negative for rash.  Neurological: Negative for headaches.    Dilation: 5 Effacement (%): 90 Station: -2;-1 Exam by:: m wilkins rnc Blood pressure 93/48, pulse 69, temperature 98.2 F (36.8 C), temperature source Oral, resp. rate 18, height 5\' 1"  (1.549 m), weight 63.05 kg (139 lb), last menstrual period 07/01/2013, SpO2 100.00%. Maternal Exam:  Abdomen: not evaluated.  Introitus: not evaluated.   Pelvis: adequate for delivery.   Cervix: Cervix evaluated by digital exam.     Fetal Exam Fetal Monitor Review: Baseline rate: 120.  Variability: moderate (6-25 bpm).   Pattern: accelerations present.    Fetal State Assessment: Category I - tracings are normal.     Physical Exam  Constitutional: She appears well-developed.  HENT:  Head: Normocephalic.  Neck: Neck supple. No thyromegaly present.  Cardiovascular: Normal rate and regular rhythm.   Respiratory:  Breath sounds normal.  GI: Soft. Bowel sounds are normal.  Skin: No rash noted.    Prenatal labs: ABO, Rh: A/POS/-- (10/20 1438) Antibody: NEG (10/20 1438) Rubella: 1.35 (10/20 1438) RPR: NON REAC (10/20 1438)  HBsAg: NEGATIVE (10/20 1438)  HIV: NON REACTIVE (10/20 1438)  GBS: NEGATIVE (04/30 1412)   Assessment/Plan: Multipara at term, active labor, Category 1 FHT Admit, anticipate an NSVD   Antionette Char 04/10/2014, 8:52 AM

## 2014-04-10 NOTE — Anesthesia Procedure Notes (Signed)
Epidural Patient location during procedure: OB Start time: 04/10/2014 8:10 AM  Staffing Anesthesiologist: Brayton Caves Performed by: anesthesiologist   Preanesthetic Checklist Completed: patient identified, site marked, surgical consent, pre-op evaluation, timeout performed, IV checked, risks and benefits discussed and monitors and equipment checked  Epidural Patient position: sitting Prep: site prepped and draped and DuraPrep Patient monitoring: continuous pulse ox and blood pressure Approach: midline Location: L3-L4 Injection technique: LOR air  Needle:  Needle type: Tuohy  Needle gauge: 17 G Needle length: 9 cm and 9 Needle insertion depth: 4 cm Catheter type: closed end flexible Catheter size: 19 Gauge Catheter at skin depth: 10 cm Test dose: negative  Assessment Events: blood not aspirated, injection not painful, no injection resistance, negative IV test and no paresthesia  Additional Notes Patient identified.  Risk benefits discussed including failed block, incomplete pain control, headache, nerve damage, paralysis, blood pressure changes, nausea, vomiting, reactions to medication both toxic or allergic, and postpartum back pain.  Patient expressed understanding and wished to proceed.  All questions were answered.  Sterile technique used throughout procedure and epidural site dressed with sterile barrier dressing. No paresthesia or other complications noted.The patient did not experience any signs of intravascular injection such as tinnitus or metallic taste in mouth nor signs of intrathecal spread such as rapid motor block. Please see nursing notes for vital signs.

## 2014-04-10 NOTE — Anesthesia Preprocedure Evaluation (Signed)
Anesthesia Evaluation  Patient identified by MRN, date of birth, ID band Patient awake    Reviewed: Allergy & Precautions, H&P , Patient's Chart, lab work & pertinent test results  Airway Mallampati: II TM Distance: >3 FB Neck ROM: full    Dental   Pulmonary  breath sounds clear to auscultation        Cardiovascular Rhythm:regular Rate:Normal     Neuro/Psych    GI/Hepatic GERD-  ,  Endo/Other    Renal/GU      Musculoskeletal   Abdominal   Peds  Hematology   Anesthesia Other Findings   Reproductive/Obstetrics (+) Pregnancy                           Anesthesia Physical Anesthesia Plan  ASA: II  Anesthesia Plan: Epidural   Post-op Pain Management:    Induction:   Airway Management Planned:   Additional Equipment:   Intra-op Plan:   Post-operative Plan:   Informed Consent: I have reviewed the patients History and Physical, chart, labs and discussed the procedure including the risks, benefits and alternatives for the proposed anesthesia with the patient or authorized representative who has indicated his/her understanding and acceptance.     Plan Discussed with:   Anesthesia Plan Comments:         Anesthesia Quick Evaluation  

## 2014-04-11 LAB — URINE MICROSCOPIC-ADD ON

## 2014-04-11 LAB — URINALYSIS, ROUTINE W REFLEX MICROSCOPIC
BILIRUBIN URINE: NEGATIVE
GLUCOSE, UA: NEGATIVE mg/dL
KETONES UR: NEGATIVE mg/dL
Nitrite: NEGATIVE
PH: 6 (ref 5.0–8.0)
Protein, ur: NEGATIVE mg/dL
Specific Gravity, Urine: 1.025 (ref 1.005–1.030)
Urobilinogen, UA: 0.2 mg/dL (ref 0.0–1.0)

## 2014-04-11 NOTE — Progress Notes (Signed)
UR chart review completed.  

## 2014-04-11 NOTE — Progress Notes (Signed)
Post Partum Day 1 Subjective: tolerating PO and + flatus Patient unable to void and having pain w/ cramping, reports some relief w/ pain medication.  Slightly flat affect. Uncertain of method for birth control.  Objective: Blood pressure 112/61, pulse 74, temperature 98 F (36.7 C), temperature source Oral, resp. rate 18, height 5\' 1"  (1.549 m), weight 139 lb (63.05 kg), last menstrual period 07/01/2013, SpO2 100.00%, unknown if currently breastfeeding.  Physical Exam:  General: alert and cooperative Lochia: appropriate Uterine Fundus: firm Incision: NA DVT Evaluation: No evidence of DVT seen on physical exam.   Recent Labs  04/10/14 0740  HGB 10.8*  HCT 33.2*    Assessment/Plan: Plan for discharge tomorrow Patient has had straight cath x2. Reports she is unable to void and doesn't feel the urge. Close monitoring throughout the day. Keep ice packs on perineum.   LOS: 1 day   Gautam Langhorst Dessa Phi 04/11/2014, 8:49 AM

## 2014-04-11 NOTE — Lactation Note (Signed)
This note was copied from the chart of Jillian Obrien. Lactation Consultation Note  Patient Name: Jillian Marin Vittone [id number] Date: 04/11/2014 Reason for consult: Initial assessment of this multipara and her newborn at 67 hours postpartum. This is mom's 5th baby but she only nursed her 29 yo and did not BF other 3.  She successfully breastfed her 29 yo for one year and states that this new baby is latching well.  She asks if it is possible to overfeed a breastfeeding baby and LC reviewed how frequent feedings can ensure milk production, as well as satisfy newborn with small amounts of rich colostrum.  LC encouraged frequent STS and cue feedings ad lib.LC encouraged review of Baby and Me pp 9, 14 and 20-25 for STS and BF information. LC provided [employer] brochure and reviewed [facility] services and list of community and web site resources.      Maternal Data Formula Feeding for Exclusion: No Infant to breast within first hour of birth: No Breastfeeding delayed due to:: Other (comment) (baby was just slightly over 1 hour of age; breastfed 15 minutes) Has patient been taught Hand Expression?: Yes (mom states that she knows how to express her colostrum/milk) Does the patient have breastfeeding experience prior to this delivery?: Yes  Feeding Feeding Type: Breast Fed Length of feed: 25 min  LATCH Score/Interventions           LATCH score=8 at several assessments per RN           Lactation Tools Discussed/Used   STS, cue feedings, hand expression  Consult Status Consult Status: Follow-up Date: 04/12/14 Follow-up type: In-patient    Zara Chess 04/11/2014, 5:36 PM

## 2014-04-12 MED ORDER — PRAMOXINE HCL 1 % RE FOAM
Freq: Three times a day (TID) | RECTAL | Status: DC | PRN
  Filled 2014-04-12: qty 15

## 2014-04-12 MED ORDER — HYDROCORTISONE ACE-PRAMOXINE 1-1 % RE FOAM: 1.0000 | Freq: Three times a day (TID) | RECTAL | Status: DC | PRN

## 2014-04-12 MED ORDER — HYDROCORTISONE ACE-PRAMOXINE 1-1 % RE FOAM
1.0000 | Freq: Three times a day (TID) | RECTAL | Status: DC | PRN
  Administered 2014-04-12: 1 via RECTAL
  Filled 2014-04-12: qty 10

## 2014-04-12 MED ORDER — OXYCODONE HCL 5 MG/5ML PO SOLN: 5.0000 mg | ORAL | Status: DC | PRN

## 2014-04-12 MED ORDER — PRAMOXINE HCL 1 % RE FOAM: Freq: Three times a day (TID) | RECTAL | Status: DC | PRN

## 2014-04-12 MED ORDER — IBUPROFEN 100 MG/5ML PO SUSP: 600.0000 mg | Freq: Four times a day (QID) | ORAL | Status: DC

## 2014-04-12 NOTE — Progress Notes (Signed)
Post Partum Day 2 Subjective: no complaints  Objective: Blood pressure 96/63, pulse 97, temperature 97.8 F (36.6 C), temperature source Oral, resp. rate 18, height 5\' 1"  (1.549 m), weight 139 lb (63.05 kg), last menstrual period 07/01/2013, SpO2 100.00%, unknown if currently breastfeeding.  Physical Exam:  General: alert and no distress Lochia: appropriate Uterine Fundus: firm Incision: none DVT Evaluation: No evidence of DVT seen on physical exam.   Recent Labs  04/10/14 0740  HGB 10.8*  HCT 33.2*    Assessment/Plan: Discharge home   LOS: 2 days   Brock Bad 04/12/2014, 8:44 AM

## 2014-04-12 NOTE — Discharge Summary (Signed)
Obstetric Discharge Summary Reason for Admission: onset of labor Prenatal Procedures: ultrasound Intrapartum Procedures: spontaneous vaginal delivery Postpartum Procedures: none Complications-Operative and Postpartum: none Hemoglobin  Date Value Ref Range Status  04/10/2014 10.8* 12.0 - 15.0 g/dL Final     HCT  Date Value Ref Range Status  04/10/2014 33.2* 36.0 - 46.0 % Final    Physical Exam:  General: alert and no distress Lochia: appropriate Uterine Fundus: firm Incision: none DVT Evaluation: No evidence of DVT seen on physical exam.  Discharge Diagnoses: Term Pregnancy-delivered  Discharge Information: Date: 04/12/2014 Activity: pelvic rest Diet: routine Medications: PNV, Ibuprofen, Colace and Percocet Condition: stable Instructions: refer to practice specific booklet Discharge to: home Follow-up Information   Follow up with Antionette Char A, MD. Schedule an appointment as soon as possible for a visit in 2 weeks.   Specialty:  Obstetrics and Gynecology   Contact information:   [address] [place] [address] [phone]       Newborn Data: Live born female  Birth Weight: 7 lb 11 oz (3487 g) APGAR: 9, 9  Home with mother.  Brock Bad 04/12/2014, 8:48 AM

## 2014-04-12 NOTE — Lactation Note (Signed)
This note was copied from the chart of Jillian Tresia Schmitz. Lactation Consultation Note Follow up consult:  Mother denies problems or questions.  LS9/10. Reviewed engorgement care and provided mother with hand pump.  Patient Name: Jillian Obrien [id number] Date: 04/12/2014 Reason for consult: Follow-up assessment   Maternal Data    Feeding    LATCH Score/Interventions                      Lactation Tools Discussed/Used     Consult Status Consult Status: PRN    Hardie Pulley 04/12/2014, 9:59 AM

## 2014-04-13 ENCOUNTER — Encounter: Payer: Medicaid Other | Admitting: Obstetrics

## 2014-04-13 ENCOUNTER — Ambulatory Visit: Payer: Self-pay

## 2014-04-13 NOTE — Lactation Note (Signed)
This note was copied from the chart of Jillian Margret Olive. Lactation Consultation Note Checked on mom to see how things are going, noted supplementing w/formula. Flat affect. Didn't appear to want to talk with me.Only had one poop right after birth. Had several voids. Encouraged to BF first then bottle feeding.  Patient Name: Jillian Obrien [id number] Date: 04/13/2014     Maternal Data    Feeding    LATCH Score/Interventions                      Lactation Tools Discussed/Used     Consult Status      Charyl Dancer 04/13/2014, 4:01 AM

## 2014-09-18 ENCOUNTER — Encounter (HOSPITAL_COMMUNITY): Payer: Self-pay | Admitting: *Deleted

## 2014-10-20 IMAGING — US US OB NUCHAL TRANSLUCENCY 1ST GEST
1 series · 13 of 28 positions shown · non-contrast
Comparison: none

[Series 1: us ob nuchal translucency 1st gest · 0.08mm/px · 13 of 38 slices shown]
[im 2/38]
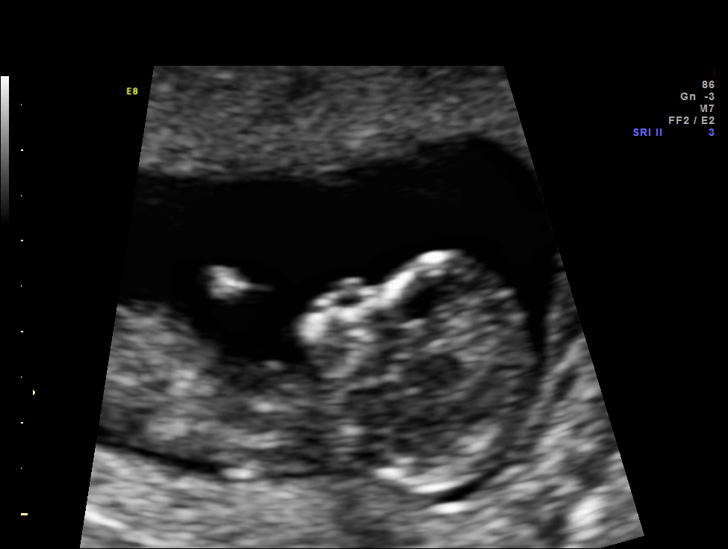
[im 5/38]
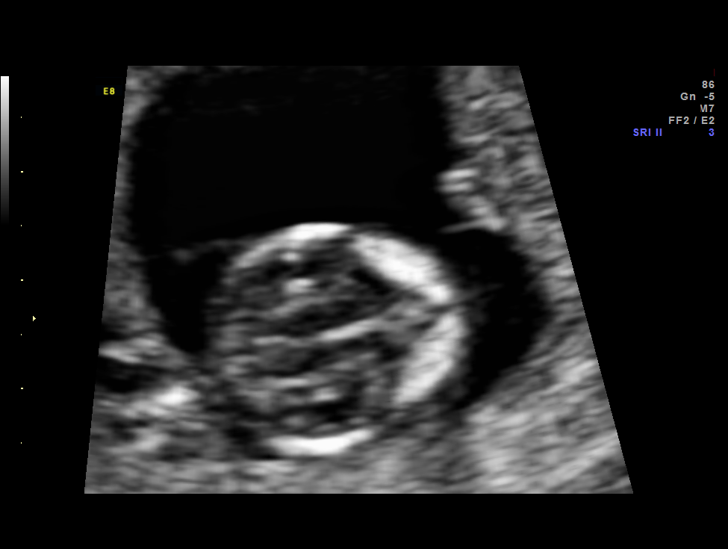
[im 7/38]
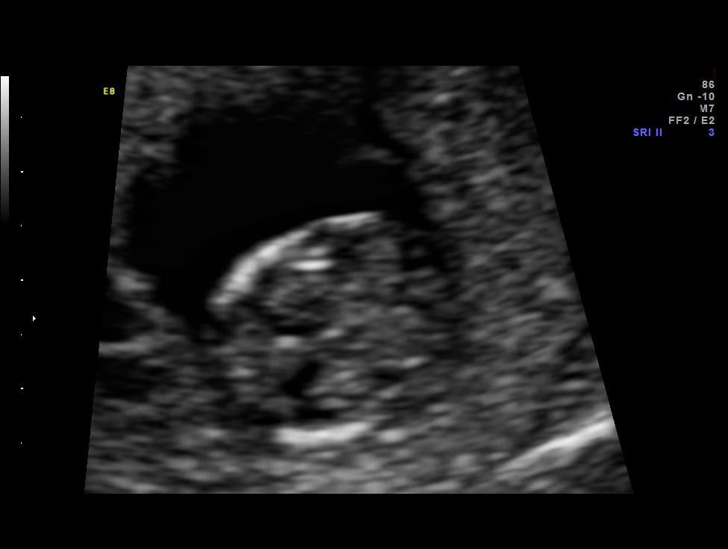
[im 10/38]
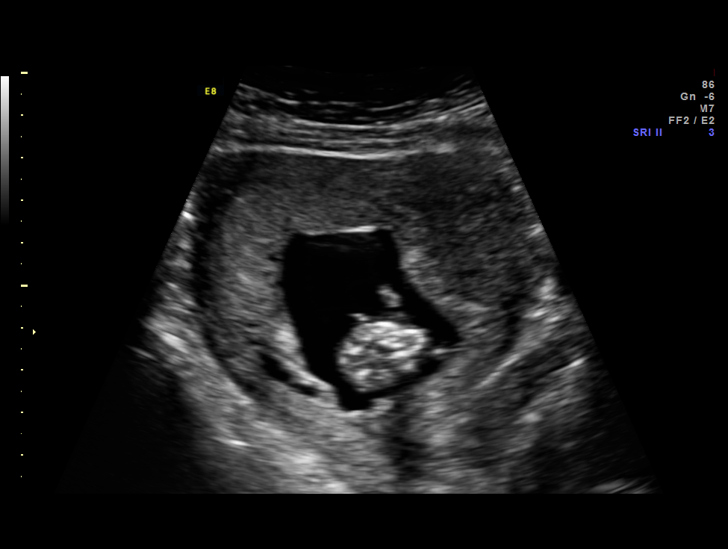
[im 13/38]
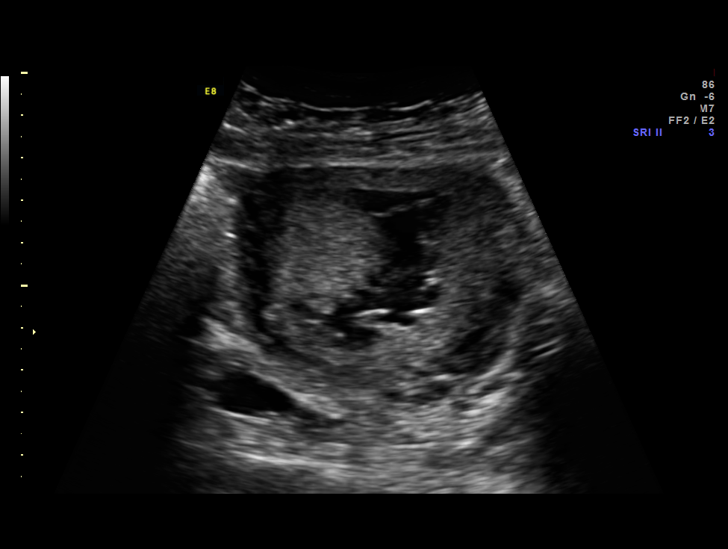
[im 16/38]
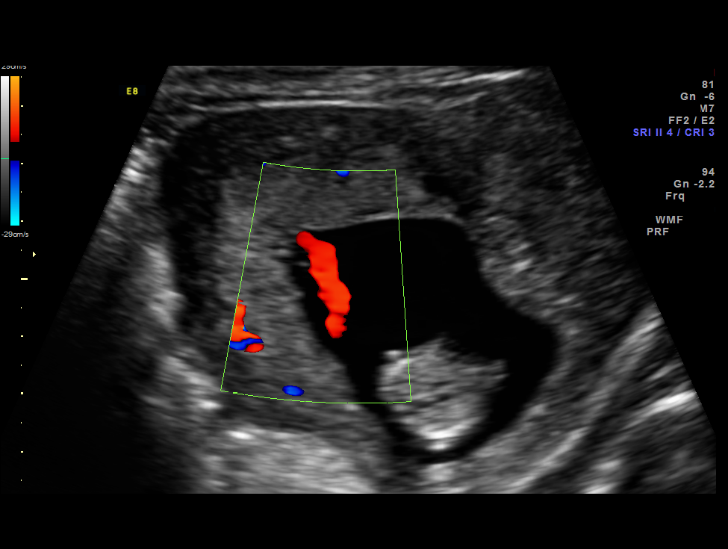
[im 20/38]
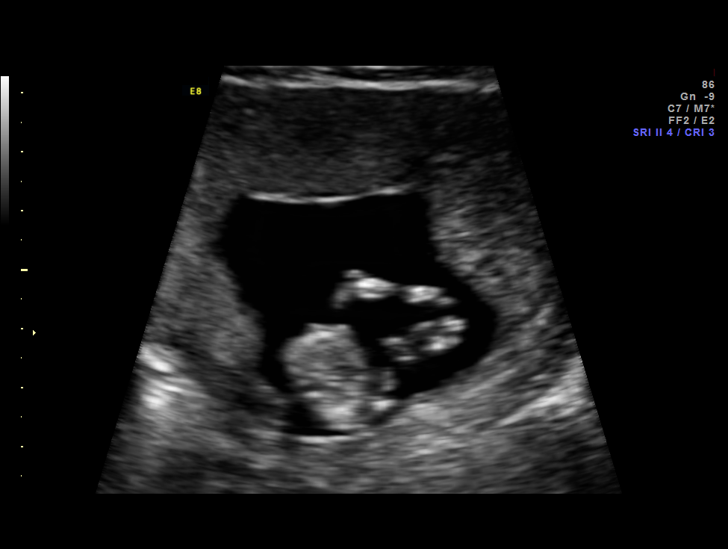
[im 22/38]
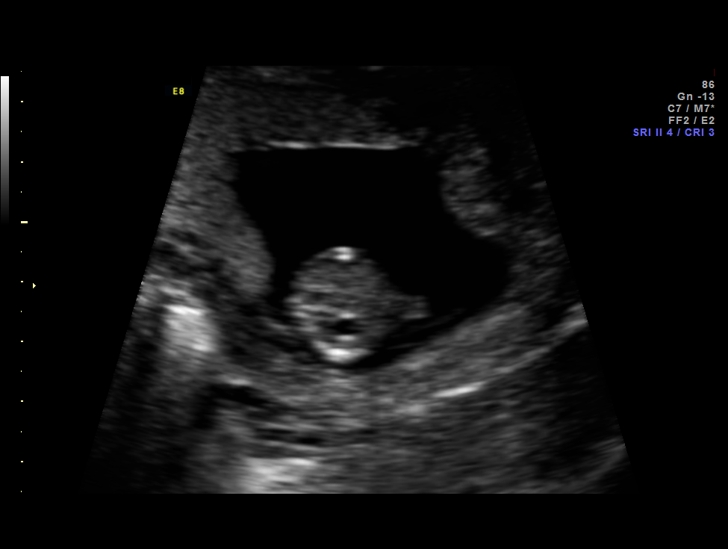
[im 25/38]
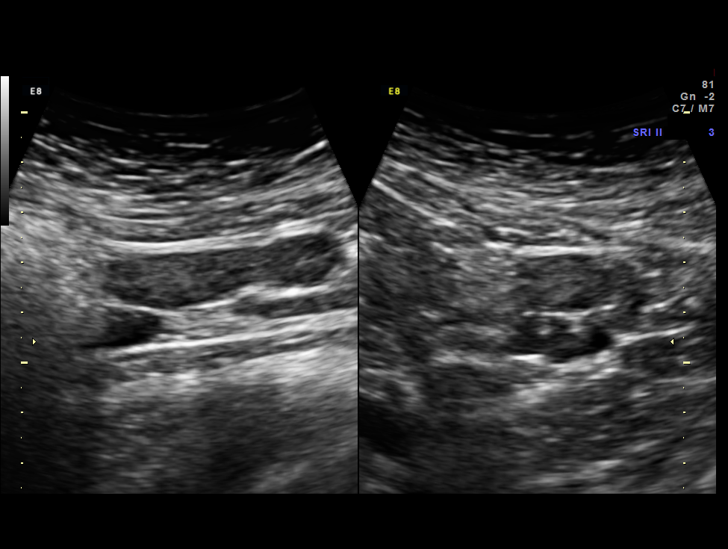
[im 28/38]
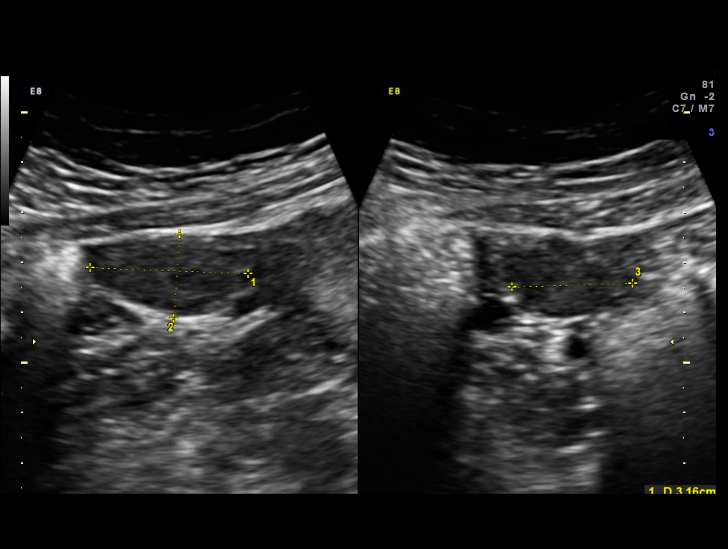
[im 31/38]
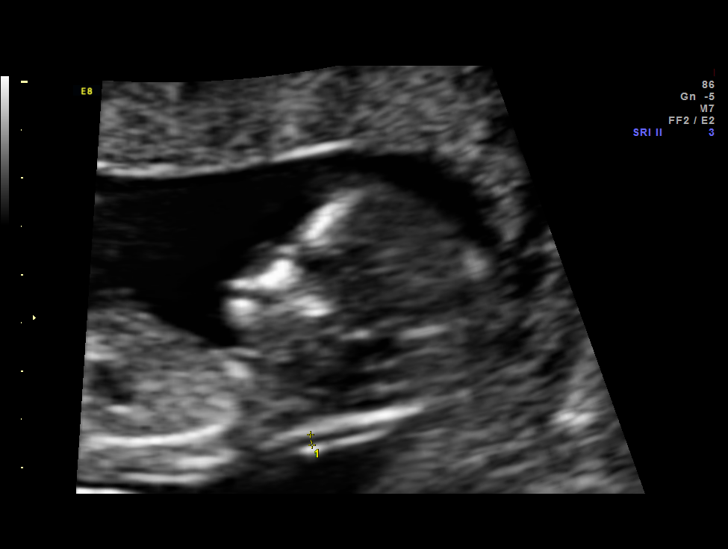
[im 33/38]
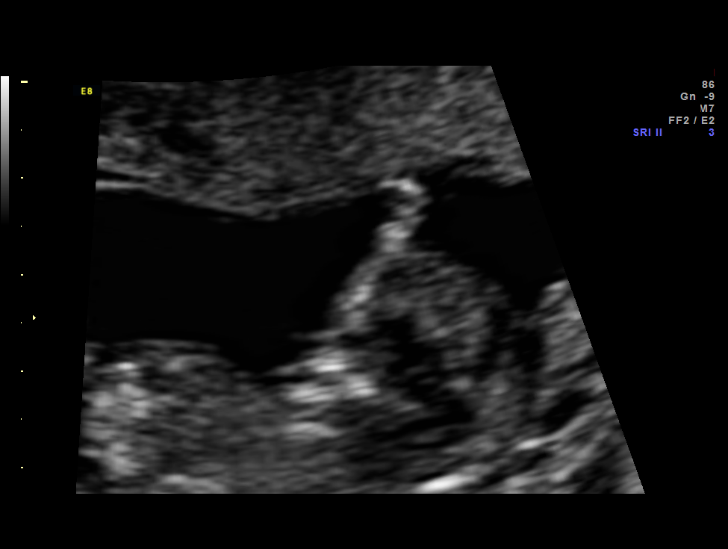
[im 36/38]
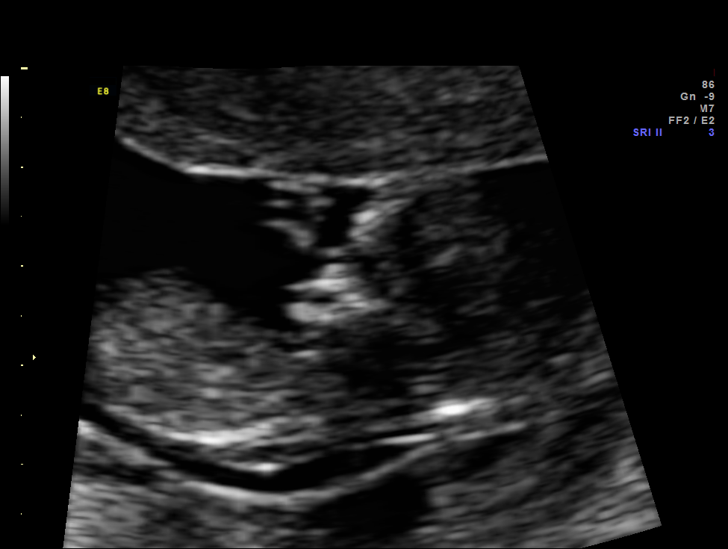

[13 of 28 positions shown; findings below may reference images not displayed]

OBSTETRICS REPORT
                      (Signed Final 09/26/2013 [DATE])

Service(s) Provided

 US FETAL NUCHAL TRANSLUCENCY                          76813.0
 MEASUREMENT
Indications

 First trimester aneuploidy screen (NT)
Fetal Evaluation

 Num Of Fetuses:    1
 Fetal Heart Rate:  155                          bpm
 Cardiac Activity:  Observed
Biometry

 CRL:     59.4  mm     G. Age:  12w 2d                 EDD:    04/08/14

 NT:       1.4  mm
Gestational Age

 LMP:           12w 3d        Date:  07/01/13                 EDD:   04/07/14
 Best:          12w 3d     Det. By:  LMP  (07/01/13)          EDD:   04/07/14
1st Trimester Genetic Sonogram Screening

 CRL:            59.4  mm    G. Age:   12w 2d                 EDD:   04/08/14
 Nuc Trans:       1.4  mm
 Nasal Bone:                 Present
Cervix Uterus Adnexa

 Cervix:       Closed.
 Uterus:       No abnormality visualized.
 Cul De Sac:   No free fluid seen.

 Left Ovary:    Within normal limits.
 Right Ovary:   Within normal limits.
 Adnexa:     No abnormality visualized.
Impression

 Single IUP at 12 [DATE] weeks
 Normal NT (1.4 mm).  Nasal bone visualized.
 First trimester aneuploidy screen performed as noted above.
Recommendations

 Please do not draw triple/quad screen, though patient should
 be offered MSAFP for neural tube defect screening.
 Recommend follow up ultrasound at approx 18 weeks for
 anatomy

## 2014-11-13 ENCOUNTER — Encounter: Payer: Self-pay | Admitting: *Deleted

## 2015-11-14 ENCOUNTER — Other Ambulatory Visit: Payer: Self-pay

## 2015-11-14 ENCOUNTER — Other Ambulatory Visit (INDEPENDENT_AMBULATORY_CARE_PROVIDER_SITE_OTHER): Payer: Medicaid Other | Admitting: *Deleted

## 2015-11-14 VITALS — BP 111/75 | HR 90 | Temp 99.0°F | Ht 60.0 in | Wt 130.0 lb

## 2015-11-14 DIAGNOSIS — Z3201 Encounter for pregnancy test, result positive: Secondary | ICD-10-CM | POA: Diagnosis not present

## 2015-11-14 DIAGNOSIS — N912 Amenorrhea, unspecified: Secondary | ICD-10-CM

## 2015-11-14 LAB — POCT URINE PREGNANCY: PREG TEST UR: POSITIVE — AB

## 2015-11-15 ENCOUNTER — Ambulatory Visit (INDEPENDENT_AMBULATORY_CARE_PROVIDER_SITE_OTHER): Payer: Medicaid Other | Admitting: Obstetrics

## 2015-11-15 ENCOUNTER — Encounter: Payer: Self-pay | Admitting: Obstetrics

## 2015-11-15 VITALS — BP 110/70 | HR 100 | Temp 98.3°F | Wt 130.0 lb

## 2015-11-15 DIAGNOSIS — Z3482 Encounter for supervision of other normal pregnancy, second trimester: Secondary | ICD-10-CM | POA: Diagnosis not present

## 2015-11-15 LAB — POCT URINALYSIS DIPSTICK
BILIRUBIN UA: NEGATIVE
GLUCOSE UA: NEGATIVE
Ketones, UA: NEGATIVE
Leukocytes, UA: NEGATIVE
Nitrite, UA: NEGATIVE
Protein, UA: NEGATIVE
RBC UA: NEGATIVE
Spec Grav, UA: 1.015
Urobilinogen, UA: NEGATIVE
pH, UA: 6

## 2015-11-15 LAB — TSH: TSH: 0.798 u[IU]/mL (ref 0.350–4.500)

## 2015-11-15 MED ORDER — VITAFOL GUMMIES 3.33-0.333-34.8 MG PO CHEW: 1.0000 | CHEWABLE_TABLET | Freq: Every day | ORAL | Status: DC

## 2015-11-15 NOTE — Progress Notes (Signed)
Subjective:    Jillian Obrien is a 29 y.o. female being seen today for her obstetrical visit. She is at 4128w4d gestation. Patient reports: no complaints . Fetal movement: normal.  Problem List Items Addressed This Visit    None    Visit Diagnoses    Supervision of other normal pregnancy, antepartum, second trimester    -  Primary    Relevant Medications    Prenatal Vit-Fe Phos-FA-Omega (VITAFOL GUMMIES) 3.33-0.333-34.8 MG CHEW    Other Relevant Orders    POCT urinalysis dipstick (Completed)    Obstetric panel    HIV antibody    Hemoglobinopathy evaluation    Varicella zoster antibody, IgG    VITAMIN D 25 Hydroxy (Vit-D Deficiency, Fractures)    Culture, OB Urine    TSH    SureSwab, Vaginosis/Vaginitis Plus    PAP,TP IMGw/HPV RNA,rflx HPVTYPE16,18/45    US MFM OB COMP + 14 WK      Patient Active Problem List   Diagnosis Date Noted  . Indication for care in labor or delivery 04/10/2014  . Normal delivery 04/10/2014  . GERD without esophagitis 03/23/2014  . Vitamin D deficiency 09/16/2013  . Supervision of pregnancy with grand multiparity 09/05/2013   Objective:    BP 110/70 mmHg  Pulse 100  Temp(Src) 98.3 F (36.8 C)  Wt 130 lb (58.968 kg)  LMP 07/08/2015 (Approximate) FHT: 150 BPM  Uterine Size: size equals dates     Assessment:    Pregnancy @ 928w4d    Plan:    Signs and symptoms of preterm labor: discussed.  Labs, problem list reviewed and updated 2 hr GTT planned Follow up in 4 weeks.

## 2015-11-16 LAB — OBSTETRIC PANEL
Antibody Screen: NEGATIVE
BASOS ABS: 0 10*3/uL (ref 0.0–0.1)
Basophils Relative: 0 % (ref 0–1)
EOS PCT: 1 % (ref 0–5)
Eosinophils Absolute: 0.1 10*3/uL (ref 0.0–0.7)
HCT: 36.4 % (ref 36.0–46.0)
Hemoglobin: 12.5 g/dL (ref 12.0–15.0)
Hepatitis B Surface Ag: NEGATIVE
LYMPHS ABS: 1.5 10*3/uL (ref 0.7–4.0)
LYMPHS PCT: 17 % (ref 12–46)
MCH: 30.3 pg (ref 26.0–34.0)
MCHC: 34.3 g/dL (ref 30.0–36.0)
MCV: 88.1 fL (ref 78.0–100.0)
MONO ABS: 0.5 10*3/uL (ref 0.1–1.0)
MPV: 10.2 fL (ref 8.6–12.4)
Monocytes Relative: 5 % (ref 3–12)
NEUTROS ABS: 6.9 10*3/uL (ref 1.7–7.7)
Neutrophils Relative %: 77 % (ref 43–77)
PLATELETS: 234 10*3/uL (ref 150–400)
RBC: 4.13 MIL/uL (ref 3.87–5.11)
RDW: 13 % (ref 11.5–15.5)
RH TYPE: POSITIVE
RUBELLA: 1.45 {index} — AB (ref <0.90)
WBC: 9 10*3/uL (ref 4.0–10.5)

## 2015-11-16 LAB — HIV ANTIBODY (ROUTINE TESTING W REFLEX): HIV: NONREACTIVE

## 2015-11-16 LAB — VARICELLA ZOSTER ANTIBODY, IGG: Varicella IgG: 976.9 Index — ABNORMAL HIGH (ref <135.00)

## 2015-11-16 LAB — VITAMIN D 25 HYDROXY (VIT D DEFICIENCY, FRACTURES): VIT D 25 HYDROXY: 17 ng/mL — AB (ref 30–100)

## 2015-11-17 LAB — CULTURE, OB URINE: Colony Count: 30000

## 2015-11-18 NOTE — L&D Delivery Note (Signed)
Delivery Note This is a 30 year old G 7 P6 who was admitted for Active phase labor.. She progressed normally with epidural to the second stage of labor.  She pushed for 2 min.  At 9:30 AM she delivered a viable infant female, cephalic, over an intact perineum.  A nuchal cord   was not identified. Infant placed on maternal abdomen.  Delayed cord clamping was performed for 5 minutes.  Cord double clamped and cut.  Apgar scores were 9 and 9. The placenta delivered spontaneously, shultz, with a 3 vessel cord.  Inspection revealed none. The uterus was firm bleeding stable.  EBL was 100.    Placenta and umbilical artery blood gas were not sent.  There were no complications during the procedure.  Mom and baby skin to skin following delivery. Left in stable condition.  female was delivered via Vaginal, Spontaneous Delivery (Presentation: Left Occiput Anterior).  APGAR: 9, 9; weight 7 lb 6.5 oz (3360 g).   Placenta status: Intact, Spontaneous.  Cord: 3 vessels with the following complications: None.  Cord pH: n/a  Anesthesia: Epidural  Episiotomy: None Lacerations: None Suture Repair: none Est. Blood Loss (mL): 100  Mom to postpartum.  Baby to Couplet care / Skin to Skin.  Roe Coombsachelle A Denney, CNM 04/10/2016, 12:03 PM

## 2015-11-19 ENCOUNTER — Other Ambulatory Visit: Payer: Self-pay | Admitting: Obstetrics

## 2015-11-19 DIAGNOSIS — B9689 Other specified bacterial agents as the cause of diseases classified elsewhere: Secondary | ICD-10-CM

## 2015-11-19 DIAGNOSIS — N760 Acute vaginitis: Principal | ICD-10-CM

## 2015-11-19 LAB — SURESWAB, VAGINOSIS/VAGINITIS PLUS
Atopobium vaginae: NOT DETECTED Log (cells/mL)
C. GLABRATA, DNA: NOT DETECTED
C. TROPICALIS, DNA: NOT DETECTED
C. albicans, DNA: NOT DETECTED
C. parapsilosis, DNA: NOT DETECTED
C. trachomatis RNA, TMA: NOT DETECTED
Gardnerella vaginalis: 6.2 Log (cells/mL)
LACTOBACILLUS SPECIES: NOT DETECTED Log (cells/mL)
MEGASPHAERA SPECIES: NOT DETECTED Log (cells/mL)
N. GONORRHOEAE RNA, TMA: NOT DETECTED
T. vaginalis RNA, QL TMA: NOT DETECTED

## 2015-11-19 LAB — HEMOGLOBINOPATHY EVALUATION
HGB A2 QUANT: 2.4 % (ref 2.2–3.2)
Hemoglobin Other: 0 %
Hgb A: 97.6 % (ref 96.8–97.8)
Hgb F Quant: 0 % (ref 0.0–2.0)
Hgb S Quant: 0 %

## 2015-11-19 MED ORDER — METRONIDAZOLE 500 MG PO TABS: 500.0000 mg | ORAL_TABLET | Freq: Two times a day (BID) | ORAL | Status: DC

## 2015-11-21 LAB — PAP, TP IMAGING W/ HPV RNA, RFLX HPV TYPE 16,18/45: HPV MRNA, HIGH RISK: NOT DETECTED

## 2015-11-26 ENCOUNTER — Ambulatory Visit (HOSPITAL_COMMUNITY)
Admission: RE | Admit: 2015-11-26 | Discharge: 2015-11-26 | Disposition: A | Discharge status: Home / Self Care | Payer: Medicaid Other | Source: Ambulatory Visit | Attending: Obstetrics | Admitting: Obstetrics

## 2015-11-26 DIAGNOSIS — Z3482 Encounter for supervision of other normal pregnancy, second trimester: Secondary | ICD-10-CM | POA: Insufficient documentation

## 2015-11-27 ENCOUNTER — Encounter: Payer: Medicaid Other | Admitting: Obstetrics

## 2015-12-05 ENCOUNTER — Other Ambulatory Visit: Payer: Self-pay | Admitting: Certified Nurse Midwife

## 2015-12-13 ENCOUNTER — Encounter: Payer: Medicaid Other | Admitting: Obstetrics

## 2016-01-01 ENCOUNTER — Encounter: Payer: Self-pay | Admitting: Obstetrics

## 2016-01-01 ENCOUNTER — Ambulatory Visit (INDEPENDENT_AMBULATORY_CARE_PROVIDER_SITE_OTHER): Payer: Medicaid Other | Admitting: Obstetrics

## 2016-01-01 VITALS — BP 120/80 | HR 81 | Wt 137.0 lb

## 2016-01-01 DIAGNOSIS — Z3482 Encounter for supervision of other normal pregnancy, second trimester: Secondary | ICD-10-CM

## 2016-01-01 LAB — POCT URINALYSIS DIPSTICK
BILIRUBIN UA: NEGATIVE
Glucose, UA: NEGATIVE
KETONES UA: NEGATIVE
Nitrite, UA: NEGATIVE
PH UA: 7
Protein, UA: NEGATIVE
RBC UA: NEGATIVE
Spec Grav, UA: 1.015
Urobilinogen, UA: NEGATIVE

## 2016-01-01 NOTE — Progress Notes (Signed)
Pt is having back pain from her shoulder down to her bottom.

## 2016-01-01 NOTE — Progress Notes (Signed)
Subjective:    Jillian Obrien is a 30 y.o. female being seen today for her obstetrical visit. She is at [redacted]w[redacted]d gestation. Patient reports: backache . Fetal movement: normal.  Problem List Items Addressed This Visit    None    Visit Diagnoses    Encounter for supervision of other normal pregnancy in second trimester    -  Primary    Relevant Orders    POCT urinalysis dipstick (Completed)      Patient Active Problem List   Diagnosis Date Noted  . Indication for care in labor or delivery 04/10/2014  . Normal delivery 04/10/2014  . GERD without esophagitis 03/23/2014  . Vitamin D deficiency 09/16/2013  . Supervision of pregnancy with grand multiparity 09/05/2013   Objective:    BP 120/80 mmHg  Pulse 81  Wt 137 lb (62.143 kg)  LMP 07/08/2015 (Approximate) FHT: 150 BPM  Uterine Size: size equals dates     Assessment:    Pregnancy @ [redacted]w[redacted]d    Plan:    OBGCT: ordered for next visit. Signs and symptoms of preterm labor: discussed.  Labs, problem list reviewed and updated 2 hr GTT planned Follow up in 3 weeks.

## 2016-01-22 ENCOUNTER — Other Ambulatory Visit: Payer: Medicaid Other

## 2016-01-22 ENCOUNTER — Encounter: Payer: Medicaid Other | Admitting: Obstetrics

## 2016-02-11 ENCOUNTER — Other Ambulatory Visit: Payer: Self-pay | Admitting: Certified Nurse Midwife

## 2016-04-10 ENCOUNTER — Encounter (HOSPITAL_COMMUNITY): Payer: Self-pay

## 2016-04-10 ENCOUNTER — Inpatient Hospital Stay (HOSPITAL_COMMUNITY): Payer: Medicaid Other | Admitting: Anesthesiology

## 2016-04-10 ENCOUNTER — Inpatient Hospital Stay (HOSPITAL_COMMUNITY)
Admission: AD | Admit: 2016-04-10 | Discharge: 2016-04-12 | DRG: 775 | Disposition: A | Discharge status: Home / Self Care | Payer: Medicaid Other | Source: Ambulatory Visit | Attending: Obstetrics | Admitting: Obstetrics

## 2016-04-10 DIAGNOSIS — K219 Gastro-esophageal reflux disease without esophagitis: Secondary | ICD-10-CM | POA: Diagnosis present

## 2016-04-10 DIAGNOSIS — O9962 Diseases of the digestive system complicating childbirth: Secondary | ICD-10-CM | POA: Diagnosis present

## 2016-04-10 DIAGNOSIS — O9902 Anemia complicating childbirth: Secondary | ICD-10-CM | POA: Diagnosis present

## 2016-04-10 DIAGNOSIS — D509 Iron deficiency anemia, unspecified: Secondary | ICD-10-CM | POA: Diagnosis present

## 2016-04-10 DIAGNOSIS — Z3A39 39 weeks gestation of pregnancy: Secondary | ICD-10-CM | POA: Diagnosis not present

## 2016-04-10 LAB — CBC
HCT: 31.3 % — ABNORMAL LOW (ref 36.0–46.0)
HEMOGLOBIN: 9.8 g/dL — AB (ref 12.0–15.0)
MCH: 24.4 pg — ABNORMAL LOW (ref 26.0–34.0)
MCHC: 31.3 g/dL (ref 30.0–36.0)
MCV: 78.1 fL (ref 78.0–100.0)
PLATELETS: 169 10*3/uL (ref 150–400)
RBC: 4.01 MIL/uL (ref 3.87–5.11)
RDW: 14.1 % (ref 11.5–15.5)
WBC: 7.8 10*3/uL (ref 4.0–10.5)

## 2016-04-10 LAB — TYPE AND SCREEN
ABO/RH(D): A POS
Antibody Screen: NEGATIVE

## 2016-04-10 LAB — RPR: RPR: NONREACTIVE

## 2016-04-10 MED ORDER — ACETAMINOPHEN 325 MG PO TABS: 650.0000 mg | ORAL_TABLET | ORAL | Status: DC | PRN

## 2016-04-10 MED ORDER — DIPHENHYDRAMINE HCL 50 MG/ML IJ SOLN: 12.5000 mg | INTRAMUSCULAR | Status: DC | PRN

## 2016-04-10 MED ORDER — SOD CITRATE-CITRIC ACID 500-334 MG/5ML PO SOLN: 30.0000 mL | ORAL | Status: DC | PRN

## 2016-04-10 MED ORDER — ONDANSETRON HCL 4 MG/2ML IJ SOLN: 4.0000 mg | INTRAMUSCULAR | Status: DC | PRN

## 2016-04-10 MED ORDER — ONDANSETRON HCL 4 MG/2ML IJ SOLN: 4.0000 mg | Freq: Four times a day (QID) | INTRAMUSCULAR | Status: DC | PRN

## 2016-04-10 MED ORDER — BISACODYL 10 MG RE SUPP: 10.0000 mg | Freq: Every day | RECTAL | Status: DC | PRN

## 2016-04-10 MED ORDER — IBUPROFEN 600 MG PO TABS
600.0000 mg | ORAL_TABLET | Freq: Four times a day (QID) | ORAL | Status: DC
  Administered 2016-04-10 – 2016-04-12 (×9): 600 mg via ORAL
  Filled 2016-04-10 (×9): qty 1

## 2016-04-10 MED ORDER — DEXTROSE 5 % IV SOLN
5.0000 10*6.[IU] | Freq: Once | INTRAVENOUS | Status: AC
  Administered 2016-04-10: 5 10*6.[IU] via INTRAVENOUS
  Filled 2016-04-10: qty 5

## 2016-04-10 MED ORDER — MEDROXYPROGESTERONE ACETATE 150 MG/ML IM SUSP: 150.0000 mg | INTRAMUSCULAR | Status: DC | PRN

## 2016-04-10 MED ORDER — DIPHENHYDRAMINE HCL 25 MG PO CAPS: 25.0000 mg | ORAL_CAPSULE | Freq: Four times a day (QID) | ORAL | Status: DC | PRN

## 2016-04-10 MED ORDER — TETANUS-DIPHTH-ACELL PERTUSSIS 5-2.5-18.5 LF-MCG/0.5 IM SUSP: 0.5000 mL | Freq: Once | INTRAMUSCULAR | Status: DC

## 2016-04-10 MED ORDER — LACTATED RINGERS IV SOLN
INTRAVENOUS | Status: DC
  Administered 2016-04-10: 06:00:00 via INTRAVENOUS

## 2016-04-10 MED ORDER — PHENYLEPHRINE 40 MCG/ML (10ML) SYRINGE FOR IV PUSH (FOR BLOOD PRESSURE SUPPORT)
80.0000 ug | PREFILLED_SYRINGE | INTRAVENOUS | Status: DC | PRN
  Filled 2016-04-10: qty 5
  Filled 2016-04-10: qty 10

## 2016-04-10 MED ORDER — DIBUCAINE 1 % RE OINT
1.0000 "application " | TOPICAL_OINTMENT | RECTAL | Status: DC
  Administered 2016-04-10 – 2016-04-12 (×6): 1 via RECTAL
  Filled 2016-04-10 (×2): qty 28

## 2016-04-10 MED ORDER — OXYTOCIN BOLUS FROM INFUSION: 500.0000 mL | INTRAVENOUS | Status: DC

## 2016-04-10 MED ORDER — ZOLPIDEM TARTRATE 5 MG PO TABS
5.0000 mg | ORAL_TABLET | Freq: Every evening | ORAL | Status: DC | PRN
Start: 2016-04-10 — End: 2016-04-12

## 2016-04-10 MED ORDER — LIDOCAINE HCL (PF) 1 % IJ SOLN
INTRAMUSCULAR | Status: DC | PRN
  Administered 2016-04-10 (×2): 4 mL via EPIDURAL

## 2016-04-10 MED ORDER — EPHEDRINE 5 MG/ML INJ
10.0000 mg | INTRAVENOUS | Status: DC | PRN
  Filled 2016-04-10: qty 2

## 2016-04-10 MED ORDER — SIMETHICONE 80 MG PO CHEW: 80.0000 mg | CHEWABLE_TABLET | ORAL | Status: DC | PRN

## 2016-04-10 MED ORDER — ONDANSETRON HCL 4 MG PO TABS: 4.0000 mg | ORAL_TABLET | ORAL | Status: DC | PRN

## 2016-04-10 MED ORDER — METHYLERGONOVINE MALEATE 0.2 MG/ML IJ SOLN: 0.2000 mg | INTRAMUSCULAR | Status: DC | PRN

## 2016-04-10 MED ORDER — FLEET ENEMA 7-19 GM/118ML RE ENEM: 1.0000 | ENEMA | RECTAL | Status: DC | PRN

## 2016-04-10 MED ORDER — MEASLES, MUMPS & RUBELLA VAC ~~LOC~~ INJ: 0.5000 mL | INJECTION | Freq: Once | SUBCUTANEOUS | Status: DC

## 2016-04-10 MED ORDER — OXYCODONE-ACETAMINOPHEN 5-325 MG PO TABS: 2.0000 | ORAL_TABLET | ORAL | Status: DC | PRN

## 2016-04-10 MED ORDER — WITCH HAZEL-GLYCERIN EX PADS
1.0000 "application " | MEDICATED_PAD | CUTANEOUS | Status: DC | PRN
  Administered 2016-04-10: 1 via TOPICAL

## 2016-04-10 MED ORDER — OXYTOCIN 40 UNITS IN LACTATED RINGERS INFUSION - SIMPLE MED
2.5000 [IU]/h | INTRAVENOUS | Status: DC
  Administered 2016-04-10: 2.5 [IU]/h via INTRAVENOUS
  Administered 2016-04-10: 39.96 [IU]/h via INTRAVENOUS
  Filled 2016-04-10: qty 1000

## 2016-04-10 MED ORDER — LIDOCAINE HCL (PF) 1 % IJ SOLN
30.0000 mL | INTRAMUSCULAR | Status: DC | PRN
  Filled 2016-04-10: qty 30

## 2016-04-10 MED ORDER — OXYCODONE-ACETAMINOPHEN 5-325 MG PO TABS: 1.0000 | ORAL_TABLET | ORAL | Status: DC | PRN

## 2016-04-10 MED ORDER — PENICILLIN G POTASSIUM 5000000 UNITS IJ SOLR
2.5000 10*6.[IU] | INTRAMUSCULAR | Status: DC
  Administered 2016-04-10: 2.5 10*6.[IU] via INTRAVENOUS
  Filled 2016-04-10 (×2): qty 2.5

## 2016-04-10 MED ORDER — OXYCODONE-ACETAMINOPHEN 5-325 MG PO TABS
1.0000 | ORAL_TABLET | ORAL | Status: DC | PRN
  Administered 2016-04-10 – 2016-04-12 (×4): 1 via ORAL
  Filled 2016-04-10 (×5): qty 1

## 2016-04-10 MED ORDER — LACTATED RINGERS IV SOLN: 500.0000 mL | Freq: Once | INTRAVENOUS | Status: DC

## 2016-04-10 MED ORDER — HYDROCORTISONE ACETATE 25 MG RE SUPP
25.0000 mg | Freq: Two times a day (BID) | RECTAL | Status: DC
  Administered 2016-04-10 – 2016-04-12 (×4): 25 mg via RECTAL
  Filled 2016-04-10 (×6): qty 1

## 2016-04-10 MED ORDER — SENNOSIDES-DOCUSATE SODIUM 8.6-50 MG PO TABS
2.0000 | ORAL_TABLET | ORAL | Status: DC
  Administered 2016-04-10 – 2016-04-11 (×2): 2 via ORAL
  Filled 2016-04-10 (×2): qty 2

## 2016-04-10 MED ORDER — LACTATED RINGERS IV SOLN: 500.0000 mL | INTRAVENOUS | Status: DC | PRN

## 2016-04-10 MED ORDER — OXYCODONE-ACETAMINOPHEN 5-325 MG PO TABS
2.0000 | ORAL_TABLET | ORAL | Status: DC | PRN
  Administered 2016-04-11: 1 via ORAL

## 2016-04-10 MED ORDER — METHYLERGONOVINE MALEATE 0.2 MG PO TABS: 0.2000 mg | ORAL_TABLET | ORAL | Status: DC | PRN

## 2016-04-10 MED ORDER — PRENATAL MULTIVITAMIN CH
1.0000 | ORAL_TABLET | Freq: Every day | ORAL | Status: DC
  Administered 2016-04-10 – 2016-04-12 (×3): 1 via ORAL
  Filled 2016-04-10 (×3): qty 1

## 2016-04-10 MED ORDER — FENTANYL 2.5 MCG/ML BUPIVACAINE 1/10 % EPIDURAL INFUSION (WH - ANES)
14.0000 mL/h | INTRAMUSCULAR | Status: DC | PRN
  Administered 2016-04-10: 11.5 mL/h via EPIDURAL
  Filled 2016-04-10: qty 125

## 2016-04-10 MED ORDER — FERROUS SULFATE 325 (65 FE) MG PO TABS
325.0000 mg | ORAL_TABLET | Freq: Two times a day (BID) | ORAL | Status: DC
  Administered 2016-04-10 – 2016-04-12 (×4): 325 mg via ORAL
  Filled 2016-04-10 (×4): qty 1

## 2016-04-10 MED ORDER — OXYTOCIN 40 UNITS IN LACTATED RINGERS INFUSION - SIMPLE MED: 2.5000 [IU]/h | INTRAVENOUS | Status: DC | PRN

## 2016-04-10 MED ORDER — BENZOCAINE-MENTHOL 20-0.5 % EX AERO
1.0000 "application " | INHALATION_SPRAY | CUTANEOUS | Status: DC | PRN
  Administered 2016-04-10: 1 via TOPICAL
  Filled 2016-04-10: qty 56

## 2016-04-10 MED ORDER — COCONUT OIL OIL: 1.0000 "application " | TOPICAL_OIL | Status: DC | PRN

## 2016-04-10 MED ORDER — PHENYLEPHRINE 40 MCG/ML (10ML) SYRINGE FOR IV PUSH (FOR BLOOD PRESSURE SUPPORT)
80.0000 ug | PREFILLED_SYRINGE | INTRAVENOUS | Status: DC | PRN
  Filled 2016-04-10: qty 5

## 2016-04-10 MED ORDER — FLEET ENEMA 7-19 GM/118ML RE ENEM: 1.0000 | ENEMA | Freq: Every day | RECTAL | Status: DC | PRN

## 2016-04-10 NOTE — Anesthesia Preprocedure Evaluation (Signed)
Anesthesia Evaluation  Patient identified by MRN, date of birth, ID band Patient awake    Reviewed: Allergy & Precautions, H&P , Patient's Chart, lab work & pertinent test results  Airway Mallampati: II  TM Distance: >3 FB Neck ROM: full    Dental no notable dental hx. (+) Teeth Intact   Pulmonary neg pulmonary ROS,    Pulmonary exam normal breath sounds clear to auscultation       Cardiovascular negative cardio ROS Normal cardiovascular exam Rhythm:regular Rate:Normal     Neuro/Psych negative neurological ROS  negative psych ROS   GI/Hepatic Neg liver ROS, GERD  Medicated and Controlled,  Endo/Other  negative endocrine ROS  Renal/GU negative Renal ROS  negative genitourinary   Musculoskeletal   Abdominal   Peds  Hematology  (+) anemia ,   Anesthesia Other Findings   Reproductive/Obstetrics (+) Pregnancy                             Anesthesia Physical Anesthesia Plan  ASA: II  Anesthesia Plan: Epidural   Post-op Pain Management:    Induction:   Airway Management Planned:   Additional Equipment:   Intra-op Plan:   Post-operative Plan:   Informed Consent: I have reviewed the patients History and Physical, chart, labs and discussed the procedure including the risks, benefits and alternatives for the proposed anesthesia with the patient or authorized representative who has indicated his/her understanding and acceptance.     Plan Discussed with: Anesthesiologist  Anesthesia Plan Comments:         Anesthesia Quick Evaluation  

## 2016-04-10 NOTE — Progress Notes (Signed)
Notified of pt arrival in MAU and exam with limited prenatal care. Will put in routine admission orders, prophylactic penicillin for GBS and epidural orders.

## 2016-04-10 NOTE — Anesthesia Postprocedure Evaluation (Signed)
Anesthesia Post Note  Patient: Lindwood QuaMaihuong Prezioso  Procedure(s) Performed: * No procedures listed *  Patient location during evaluation: Mother Baby Anesthesia Type: Epidural Level of consciousness: awake and alert Pain management: satisfactory to patient Vital Signs Assessment: post-procedure vital signs reviewed and stable Respiratory status: respiratory function stable Cardiovascular status: stable Postop Assessment: no headache, no backache, epidural receding, patient able to bend at knees, no signs of nausea or vomiting and adequate PO intake Anesthetic complications: no Comments: Comfort level was assessed by AnesthesiaTeam and the patient was pleased with the care, interventions, and services provided by the Department of Anesthesia.     Last Vitals:  Filed Vitals:   04/10/16 1121 04/10/16 1248  BP: 113/53 105/58  Pulse: 59 62  Temp: 36.8 C 36.8 C  Resp: 16 16    Last Pain:  Filed Vitals:   04/10/16 1250  PainSc: 3    Pain Goal: Patients Stated Pain Goal: 3 (04/10/16 0734)               Karleen DolphinFUSSELL,Eda Magnussen

## 2016-04-10 NOTE — Anesthesia Procedure Notes (Signed)
Epidural Patient location during procedure: OB Start time: 04/10/2016 7:50 AM  Staffing Anesthesiologist: Mal AmabileFOSTER, Tobechukwu Emmick Performed by: anesthesiologist   Preanesthetic Checklist Completed: patient identified, site marked, surgical consent, pre-op evaluation, timeout performed, IV checked, risks and benefits discussed and monitors and equipment checked  Epidural Patient position: sitting Prep: site prepped and draped and DuraPrep Patient monitoring: continuous pulse ox and blood pressure Approach: midline Location: L3-L4 Injection technique: LOR air  Needle:  Needle type: Tuohy  Needle gauge: 17 G Needle length: 9 cm and 9 Needle insertion depth: 6 cm Catheter type: closed end flexible Catheter size: 19 Gauge Catheter at skin depth: 11 cm Test dose: negative and Other  Assessment Events: blood not aspirated, injection not painful, no injection resistance, negative IV test and no paresthesia  Additional Notes Patient identified. Risks and benefits discussed including failed block, incomplete  Pain control, post dural puncture headache, nerve damage, paralysis, blood pressure Changes, nausea, vomiting, reactions to medications-both toxic and allergic and post Partum back pain. All questions were answered. Patient expressed understanding and wished to proceed. Sterile technique was used throughout procedure. Epidural site was Dressed with sterile barrier dressing. No paresthesias, signs of intravascular injection Or signs of intrathecal spread were encountered.  Patient was more comfortable after the epidural was dosed. Please see RN's note for documentation of vital signs and FHR which are stable.

## 2016-04-10 NOTE — Lactation Note (Signed)
This note was copied from a baby's chart. Lactation Consultation Note  Patient Name: Jillian Obrien ZOXWR'UToday's Date: 04/10/2016 Reason for consult: Initial assessment Baby at 7 hr of life. Dyad is sleeping. Left handouts.   Maternal Data    Feeding Feeding Type: Breast Fed Length of feed: 15 min  LATCH Score/Interventions                      Lactation Tools Discussed/Used     Consult Status Consult Status: Follow-up Date: 04/10/16 Follow-up type: In-patient    Rulon Eisenmengerlizabeth E Shams Fill 04/10/2016, 5:12 PM

## 2016-04-10 NOTE — Lactation Note (Signed)
This note was copied from a baby's chart. Lactation Consultation Note  Patient Name: Jillian Obrien UJWJX'BToday's Date: 04/10/2016 Reason for consult: Follow-up assessment Baby at 11 hr of life. Experienced bf mom reports feedings are going well. She denies breast or nipple pain, no concerns voiced. Discussed baby behavior, feeding frequency, baby belly size, voids, wt loss, breast changes, and nipple care. She stated she can manually express, has seen colostrum, and has a spoon in the room. Given lactation handouts. Aware of OP services and support group.     Maternal Data Has patient been taught Hand Expression?: Yes Does the patient have breastfeeding experience prior to this delivery?: Yes  Feeding Feeding Type: Breast Fed  LATCH Score/Interventions                      Lactation Tools Discussed/Used WIC Program: No   Consult Status Consult Status: Follow-up Date: 04/11/16 Follow-up type: In-patient    Rulon Eisenmengerlizabeth E Darlisa Spruiell 04/10/2016, 9:06 PM

## 2016-04-10 NOTE — H&P (Signed)
Jillian Obrien is a 30 y.o. female presenting for UC's. Maternal Medical History:  Reason for admission: Contractions.   Fetal activity: Perceived fetal activity is normal.   Last perceived fetal movement was within the past hour.    Prenatal Complications - Diabetes: none.    OB History    Gravida Para Term Preterm AB TAB SAB Ectopic Multiple Living   7 5 5  0 1 0 1 0 0 5     Past Medical History  Diagnosis Date  . No pertinent past medical history   . Medical history non-contributory    Past Surgical History  Procedure Laterality Date  . No past surgeries     Family History: family history is not on file. Social History:  reports that she has never smoked. She has never used smokeless tobacco. She reports that she does not drink alcohol or use illicit drugs.   Prenatal Transfer Tool  Maternal Diabetes: No Genetic Screening: Declined Maternal Ultrasounds/Referrals: Normal Fetal Ultrasounds or other Referrals:  None Maternal Substance Abuse:  No Significant Maternal Medications:  None Significant Maternal Lab Results:  None Other Comments:  None  Review of Systems  All other systems reviewed and are negative.   Dilation: 5 Effacement (%): 60, 70 Station: -3 Exam by:: Academic librarianCaroline brewer RNC Blood pressure 133/76, pulse 70, temperature 98.2 F (36.8 C), temperature source Oral, resp. rate 18, height 5\' 1"  (1.549 m), weight 147 lb (66.679 kg), last menstrual period 07/08/2015, unknown if currently breastfeeding. Maternal Exam:  Uterine Assessment: Contraction strength is moderate.  Abdomen: Patient reports no abdominal tenderness. Fetal presentation: vertex  Introitus: Normal vulva. Normal vagina.  Pelvis: adequate for delivery.   Cervix: Cervix evaluated by digital exam.     Physical Exam  Nursing note and vitals reviewed. Constitutional: She is oriented to person, place, and time. She appears well-developed and well-nourished.  HENT:  Head: Normocephalic and  atraumatic.  Eyes: Conjunctivae are normal. Pupils are equal, round, and reactive to light.  Neck: Normal range of motion. Neck supple.  Cardiovascular: Normal rate and regular rhythm.   Respiratory: Effort normal and breath sounds normal.  GI: Soft.  Genitourinary: Vagina normal and uterus normal.  Musculoskeletal: Normal range of motion.  Neurological: She is alert and oriented to person, place, and time.  Skin: Skin is warm and dry.  Psychiatric: She has a normal mood and affect. Her behavior is normal. Judgment and thought content normal.    Prenatal labs: ABO, Rh: A/POS/-- (12/29 1355) Antibody: NEG (12/29 1355) Rubella: 1.45 (12/29 1355) RPR: NON REAC (12/29 1355)  HBsAg: NEGATIVE (12/29 1355)  HIV: NONREACTIVE (12/29 1355)  GBS:     Assessment/Plan: 39.4 weeks.  Active labor.  Admit.   Jalyn Dutta A 04/10/2016, 5:38 AM

## 2016-04-10 NOTE — MAU Note (Signed)
Pt presents complaining of contractions every 5minutes and bloody discharge. Denies leaking of fluid. Reports some fetal movement. [facility] hasn't been seen in a while.

## 2016-04-11 LAB — CBC
HCT: 28.1 % — ABNORMAL LOW (ref 36.0–46.0)
Hemoglobin: 8.9 g/dL — ABNORMAL LOW (ref 12.0–15.0)
MCH: 25.1 pg — ABNORMAL LOW (ref 26.0–34.0)
MCHC: 31.7 g/dL (ref 30.0–36.0)
MCV: 79.2 fL (ref 78.0–100.0)
PLATELETS: 155 10*3/uL (ref 150–400)
RBC: 3.55 MIL/uL — ABNORMAL LOW (ref 3.87–5.11)
RDW: 14.1 % (ref 11.5–15.5)
WBC: 8.7 10*3/uL (ref 4.0–10.5)

## 2016-04-11 NOTE — Progress Notes (Signed)
Post Partum Day 1 Subjective: no complaints  Objective: Blood pressure 107/50, pulse 77, temperature 98.6 F (37 C), temperature source Oral, resp. rate 18, height 5\' 1"  (1.549 m), weight 147 lb (66.679 kg), last menstrual period 07/08/2015, SpO2 100 %, unknown if currently breastfeeding.  Physical Exam:  General: alert and no distress Lochia: appropriate Uterine Fundus: firm Incision: none DVT Evaluation: No evidence of DVT seen on physical exam.   Recent Labs  04/10/16 0614 04/11/16 0536  HGB 9.8* 8.9*  HCT 31.3* 28.1*    Assessment/Plan: Anemia.  Chronic iron deficiency.  Stable.  Iron Rx. Plan for discharge tomorrow   LOS: 1 day   HARPER,CHARLES A 04/11/2016, 11:41 AM

## 2016-04-11 NOTE — Lactation Note (Signed)
This note was copied from a baby's chart. Lactation Consultation Note  Mother states baby recently breastfed. Denies problems or questions. Mom encouraged to feed baby 8-12 times/24 hours and with feeding cues.  Call if she needs further assistance.  Patient Name: Jillian Obrien Today's Date: 04/11/2016     Maternal Data    Feeding Length of feed: 60 min (on and off)  LATCH Score/Interventions Latch: Grasps breast easily, tongue down, lips flanged, rhythmical sucking.  Audible Swallowing: A few with stimulation Intervention(s): Hand expression  Type of Nipple: Everted at rest and after stimulation  Comfort (Breast/Nipple): Soft / non-tender     Hold (Positioning): No assistance needed to correctly position infant at breast.  LATCH Score: 9  Lactation Tools Discussed/Used     Consult Status      Dahlia ByesBerkelhammer, [facility] 04/11/2016, 12:45 PM
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
## 2016-04-12 NOTE — Progress Notes (Signed)
Patient ID: Jillian Obrien, female   DOB: January 13, 1986, 30 y.o.   MRN: [id number] Postpartum day 2 Blood pressure 122/71 pulse is 66 respiration 18 Fundus firm Lochia moderate Home today

## 2016-04-16 ENCOUNTER — Other Ambulatory Visit: Payer: Self-pay | Admitting: Certified Nurse Midwife

## 2016-04-30 NOTE — Discharge Summary (Signed)
Obstetric Discharge Summary Reason for Admission: onset of labor Prenatal Procedures: none Intrapartum Procedures: spontaneous vaginal delivery Postpartum Procedures: none Complications-Operative and Postpartum: none HEMOGLOBIN  Date Value Ref Range Status  04/11/2016 8.9* 12.0 - 15.0 g/dL Final   HCT  Date Value Ref Range Status  04/11/2016 28.1* 36.0 - 46.0 % Final    Physical Exam:  General: alert Lochia: appropriate Uterine Fundus: firm Incision: healing well DVT Evaluation: No evidence of DVT seen on physical exam.  Discharge Diagnoses: Term Pregnancy-delivered  Discharge Information: Date: 04/30/2016 Activity: pelvic rest Diet: routine Medications: Tylenol #3 Condition: stable Instructions: refer to practice specific booklet Discharge to: home   Newborn Data: Live born female  Birth Weight: 7 lb 6.5 oz (3360 g) APGAR: 9, 9  Home with mother.  Jillian Obrien A 04/30/2016, 7:09 AM

## 2016-11-17 NOTE — L&D Delivery Note (Signed)
Patient is 31 y.o. [id number] 4353w1d admitted in active labor. S/p augmentation with pitocin.  AROM at 1050.  Prenatal course also complicated by no prenatal care.  Delivery Note At 11:29 AM a viable female was delivered via  (Presentation: ROA).  APGAR: 9,9 ; weight  pending.   Placenta status: spontaneous, intact.  Cord: three vessel  Anesthesia:  Epidural Episiotomy:  None Lacerations:  None Suture Repair: N/A Est. Blood Loss (mL):  150  Mom to postpartum.  Baby to Couplet care / Skin to Skin.   Upon arrival to room, patient was complete.  She pushed with good maternal effort to deliver a viable female infant in cephalic, ROA position. No nuchal cord present. Baby delivered without difficulty, was noted to have good tone and placed on maternal abdomen for oral suctioning, drying and stimulation. Delayed cord clamping performed. Placenta delivered spontaneously with gentle cord traction. Fundus firm with massage and Pitocin. Perineum inspected and found to have no lacerations. Counts of sharps, instruments, and lap pads were all correct.   Jearld LeschMary K Key, DO PGY-2 07/24/2017, 11:45 AM  OB FELLOW DELIVERY ATTESTATION  I was gloved and present for the delivery in its entirety, and I agree with the above resident's note.    Frederik PearJulie P Degele, MD OB Fellow 12:06 PM

## 2016-12-19 IMAGING — US US MFM OB COMP +14 WKS
1 series · 14 of 28 positions shown · non-contrast
Comparison: none

[Series 1: us mfm ob comp +14 wks · 14 of 66 slices shown]
[im 3/66]
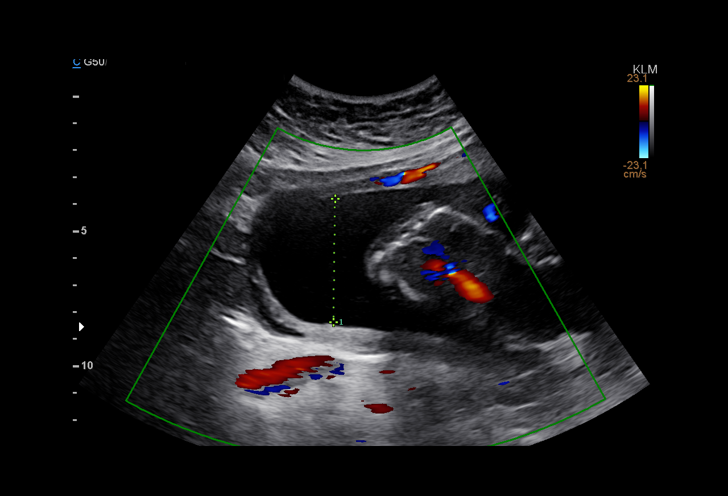
[im 8/66]
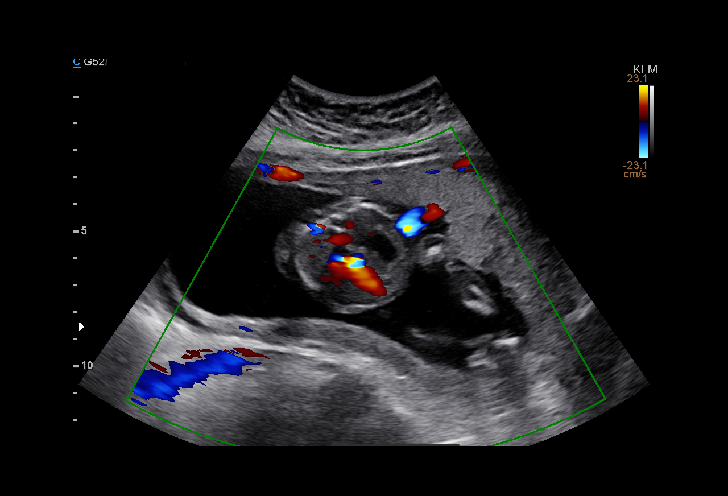
[im 13/66]
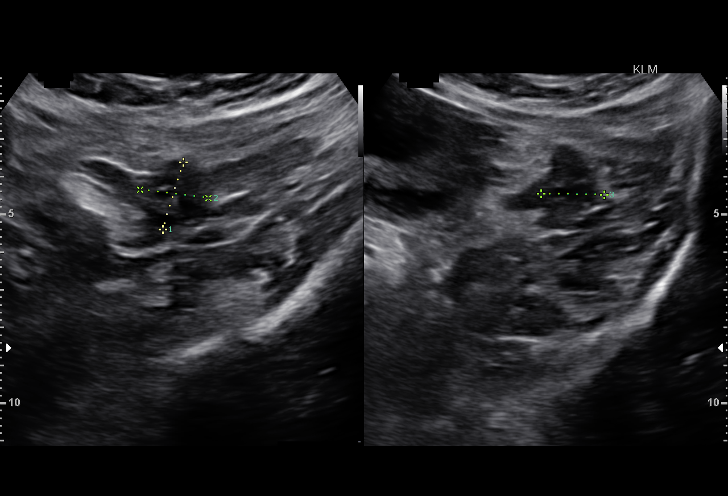
[im 17/66]
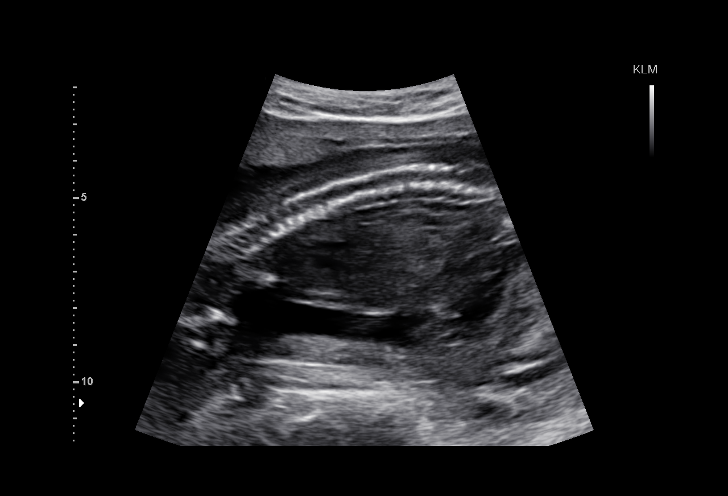
[im 22/66]
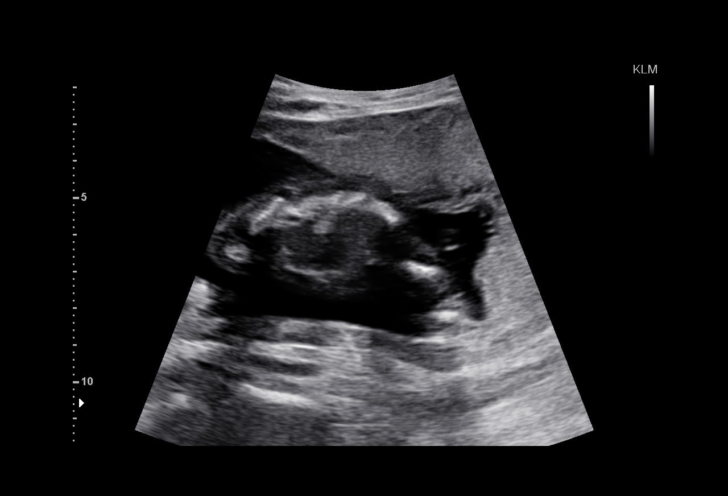
[im 27/66]
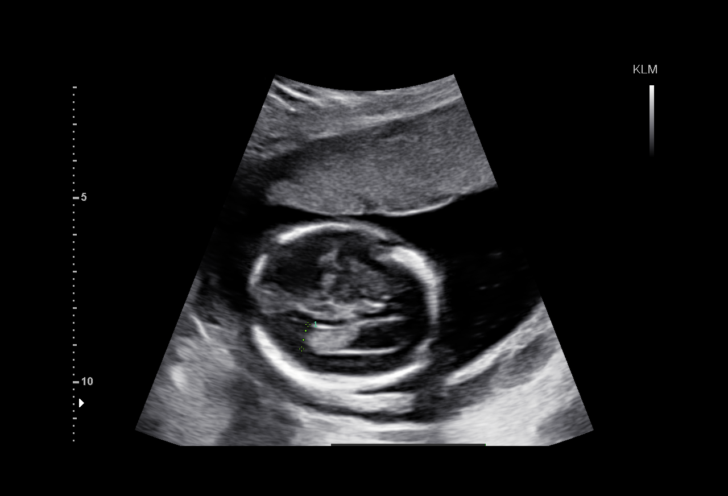
[im 32/66]
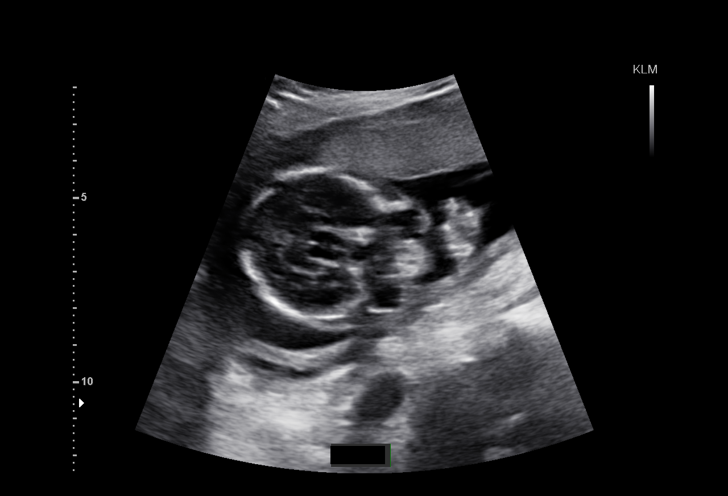
[im 37/66]
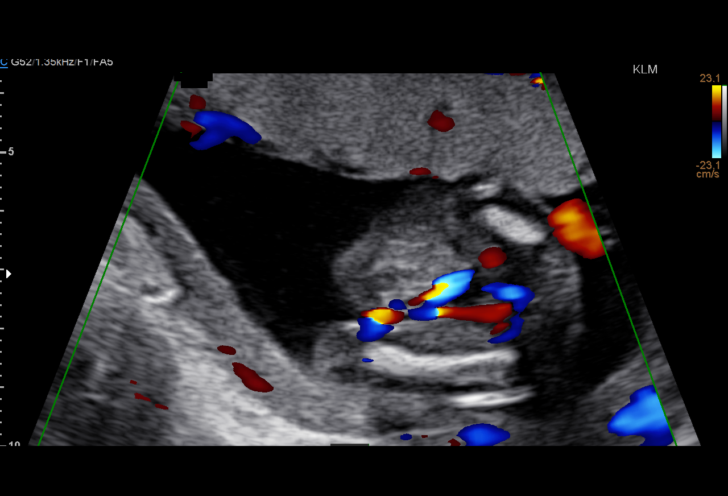
[im 41/66]
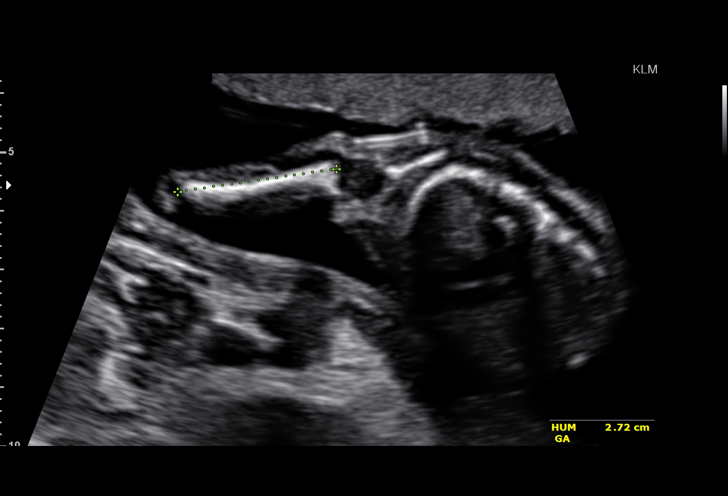
[im 46/66]
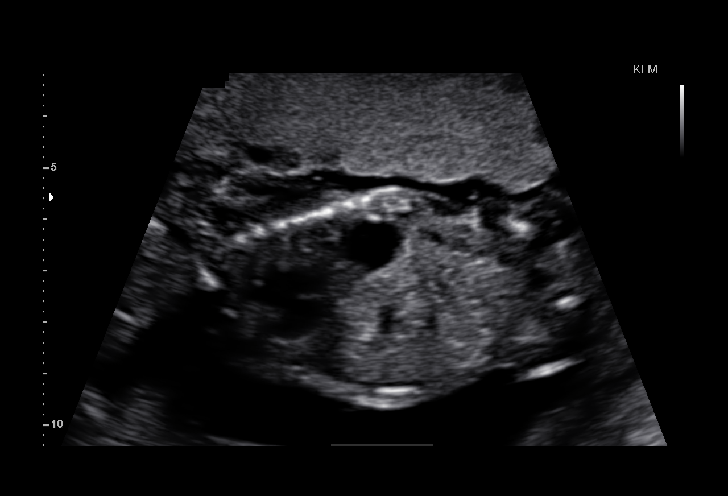
[im 51/66]
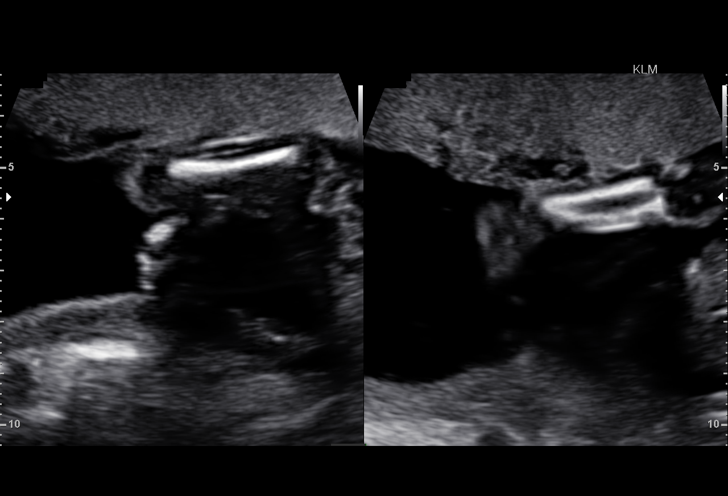
[im 56/66]
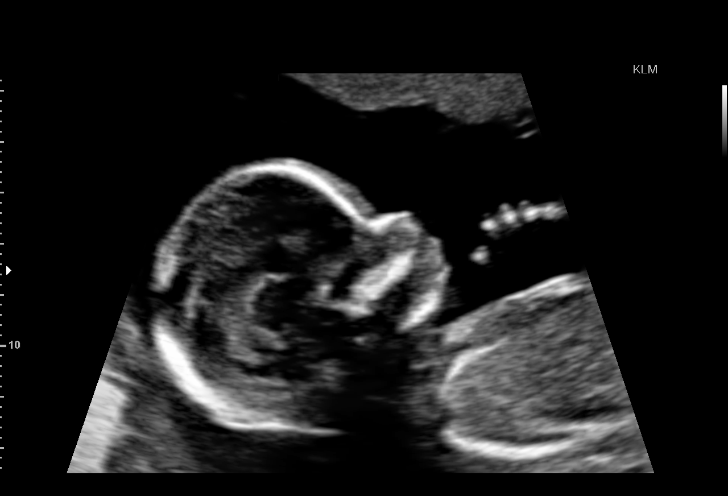
[im 61/66]
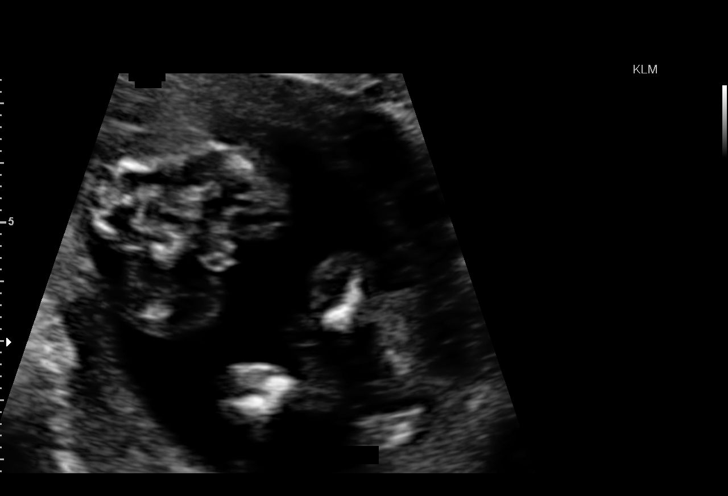
[im 66/66]
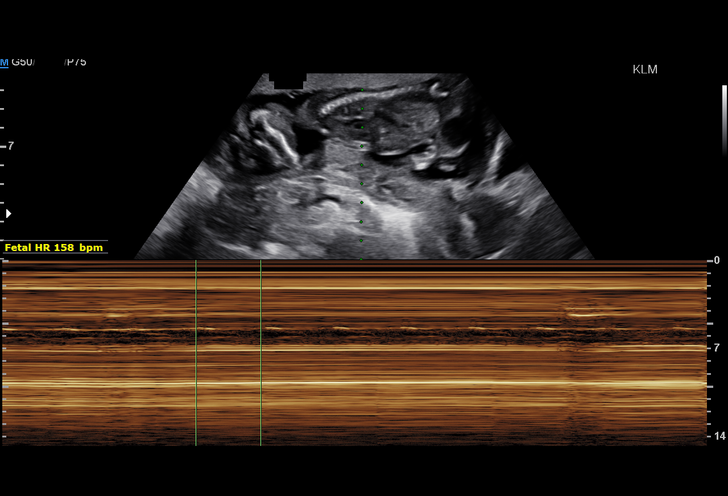

[14 of 28 positions shown; findings below may reference images not displayed]

1  YOUNG-LAE BEZZON           [id number]      [id number]     [id number]
Indications

Basic anatomic survey                          Z36
18 weeks gestation of pregnancy
OB History

Gravidity:    7         Term:   5        Prem:   0         SAB:   1
TOP:          0       Ectopic:  0        Living: 5
Fetal Evaluation

Num Of Fetuses:     1
Fetal Heart         153
Rate(bpm):
Cardiac Activity:   Observed
Presentation:       Transverse, head to maternal right
Placenta:           Anterior, above cervical os
P. Cord Insertion:  Visualized

Amniotic Fluid
AFI FV:      Subjectively within normal limits
Larg Pckt:    4.6  cm
Biometry

BPD:        43  mm     G. Age:  19w 0d                  CI:         73.15  %    70 - 86
FL/HC:       16.6  %    16.1 -
HC:      159.8  mm     G. Age:  18w 6d         48  %    HC/AC:       1.18       1.09 -
AC:      135.1  mm     G. Age:  19w 0d         55  %    FL/BPD:      61.6  %
FL:       26.5  mm     G. Age:  18w 0d         22  %    FL/AC:       19.6  %    20 - 24
HUM:      27.1  mm     G. Age:  18w 4d         50  %

Est. FW:     247   gm     0 lb 9 oz     43  %
Gestational Age

LMP:           20w 1d        Date:  07/08/15                 EDD:    04/13/16
U/S Today:     18w 5d                                        EDD:    04/23/16
Best:          18w 5d     Det. By:  U/S (11/26/15)           EDD:    04/23/16
Anatomy

Cranium:          Appears normal         Aortic Arch:      Appears normal
Fetal Cavum:      Appears normal         Ductal Arch:      Appears normal
Ventricles:       Appears normal         Diaphragm:        Appears normal
Choroid Plexus:   Appears normal         Stomach:          Appears normal, left
sided
Cerebellum:       Appears normal         Abdomen:          Appears normal
Posterior Fossa:  Appears normal         Abdominal Wall:   Appears nml (cord
insert, abd wall)
Nuchal Fold:      Not applicable (>20    Cord Vessels:     Appears normal (3
wks GA)                                  vessel cord)
Face:             Appears normal         Kidneys:          Appear normal
(orbits and profile)
Lips:             Not well visualized    Bladder:          Appears normal
Fetal Thoracic:   Appears normal         Spine:            Appears normal
Heart:            Limited but            Upper             Appears normal
appeared normal        Extremities:
RVOT:             Appears normal         Lower             Appears normal
Extremities:
LVOT:             Appears normal

Other:  Fetus appears to be a female. Heels and 5th digit visualized. Nasal
bone visualized. Technically difficult due to fetal position.
Cervix Uterus Adnexa

Cervix
Length:            4.3  cm.
Normal appearance by transabdominal scan.

Left Ovary
Within normal limits.

Right Ovary
Within normal limits.
Impression

SIUP at 18+5 weeks
Normal detailed fetal anatomy; limited views of upper lip and
4-chamber
Markers of aneuploidy: none
Normal amniotic fluid volume
EDC based on today's measurements; 04/23/16
Recommendations

Follow-up as clinically indicated

## 2017-07-24 ENCOUNTER — Inpatient Hospital Stay (HOSPITAL_COMMUNITY): Payer: Medicaid Other | Admitting: Anesthesiology

## 2017-07-24 ENCOUNTER — Inpatient Hospital Stay (HOSPITAL_COMMUNITY)
Admission: AD | Admit: 2017-07-24 | Discharge: 2017-07-26 | DRG: 775 | Disposition: A | Discharge status: Home / Self Care | Payer: Medicaid Other | Source: Ambulatory Visit | Attending: Obstetrics and Gynecology | Admitting: Obstetrics and Gynecology

## 2017-07-24 ENCOUNTER — Encounter (HOSPITAL_COMMUNITY): Payer: Self-pay

## 2017-07-24 DIAGNOSIS — Z3A39 39 weeks gestation of pregnancy: Secondary | ICD-10-CM

## 2017-07-24 DIAGNOSIS — D649 Anemia, unspecified: Secondary | ICD-10-CM | POA: Diagnosis present

## 2017-07-24 DIAGNOSIS — O0930 Supervision of pregnancy with insufficient antenatal care, unspecified trimester: Secondary | ICD-10-CM

## 2017-07-24 DIAGNOSIS — O9902 Anemia complicating childbirth: Principal | ICD-10-CM | POA: Diagnosis present

## 2017-07-24 DIAGNOSIS — O26893 Other specified pregnancy related conditions, third trimester: Secondary | ICD-10-CM | POA: Diagnosis present

## 2017-07-24 LAB — CBC
HEMATOCRIT: 26.3 % — AB (ref 36.0–46.0)
HEMOGLOBIN: 8.6 g/dL — AB (ref 12.0–15.0)
MCH: 25.3 pg — AB (ref 26.0–34.0)
MCHC: 32.7 g/dL (ref 30.0–36.0)
MCV: 77.4 fL — ABNORMAL LOW (ref 78.0–100.0)
Platelets: 177 10*3/uL (ref 150–400)
RBC: 3.4 MIL/uL — AB (ref 3.87–5.11)
RDW: 15.7 % — ABNORMAL HIGH (ref 11.5–15.5)
WBC: 6.9 10*3/uL (ref 4.0–10.5)

## 2017-07-24 LAB — RAPID URINE DRUG SCREEN, HOSP PERFORMED
Amphetamines: NOT DETECTED
BARBITURATES: NOT DETECTED
Benzodiazepines: NOT DETECTED
COCAINE: NOT DETECTED
Opiates: NOT DETECTED
Tetrahydrocannabinol: NOT DETECTED

## 2017-07-24 LAB — TYPE AND SCREEN
ABO/RH(D): A POS
ANTIBODY SCREEN: NEGATIVE

## 2017-07-24 LAB — RPR: RPR: NONREACTIVE

## 2017-07-24 LAB — GROUP B STREP BY PCR: Group B strep by PCR: NEGATIVE

## 2017-07-24 LAB — RAPID HIV SCREEN (HIV 1/2 AB+AG)
HIV 1/2 ANTIBODIES: NONREACTIVE
HIV-1 P24 Antigen - HIV24: NONREACTIVE

## 2017-07-24 LAB — POCT FERN TEST: POCT FERN TEST: NEGATIVE

## 2017-07-24 LAB — HEPATITIS B SURFACE ANTIGEN: HEP B S AG: NEGATIVE

## 2017-07-24 MED ORDER — COCONUT OIL OIL
1.0000 "application " | TOPICAL_OIL | Status: DC | PRN
  Administered 2017-07-26: 1 via TOPICAL
  Filled 2017-07-24: qty 120

## 2017-07-24 MED ORDER — PHENYLEPHRINE 40 MCG/ML (10ML) SYRINGE FOR IV PUSH (FOR BLOOD PRESSURE SUPPORT)
80.0000 ug | PREFILLED_SYRINGE | INTRAVENOUS | Status: DC | PRN
  Filled 2017-07-24: qty 5

## 2017-07-24 MED ORDER — LIDOCAINE HCL (PF) 1 % IJ SOLN
30.0000 mL | INTRAMUSCULAR | Status: DC | PRN
  Filled 2017-07-24: qty 30

## 2017-07-24 MED ORDER — PHENYLEPHRINE 40 MCG/ML (10ML) SYRINGE FOR IV PUSH (FOR BLOOD PRESSURE SUPPORT)
80.0000 ug | PREFILLED_SYRINGE | INTRAVENOUS | Status: DC | PRN
  Filled 2017-07-24: qty 5
  Filled 2017-07-24: qty 10

## 2017-07-24 MED ORDER — EPHEDRINE 5 MG/ML INJ
10.0000 mg | INTRAVENOUS | Status: DC | PRN
  Filled 2017-07-24: qty 2

## 2017-07-24 MED ORDER — IBUPROFEN 600 MG PO TABS
600.0000 mg | ORAL_TABLET | Freq: Four times a day (QID) | ORAL | Status: DC
  Administered 2017-07-24 – 2017-07-25 (×4): 600 mg via ORAL
  Filled 2017-07-24 (×5): qty 1

## 2017-07-24 MED ORDER — ONDANSETRON HCL 4 MG/2ML IJ SOLN: 4.0000 mg | INTRAMUSCULAR | Status: DC | PRN

## 2017-07-24 MED ORDER — OXYCODONE-ACETAMINOPHEN 5-325 MG PO TABS: 1.0000 | ORAL_TABLET | ORAL | Status: DC | PRN

## 2017-07-24 MED ORDER — PRENATAL MULTIVITAMIN CH
1.0000 | ORAL_TABLET | Freq: Every day | ORAL | Status: DC
  Administered 2017-07-25: 1 via ORAL
  Filled 2017-07-24: qty 1

## 2017-07-24 MED ORDER — FLEET ENEMA 7-19 GM/118ML RE ENEM: 1.0000 | ENEMA | RECTAL | Status: DC | PRN

## 2017-07-24 MED ORDER — ZOLPIDEM TARTRATE 5 MG PO TABS: 5.0000 mg | ORAL_TABLET | Freq: Every evening | ORAL | Status: DC | PRN

## 2017-07-24 MED ORDER — ACETAMINOPHEN 325 MG PO TABS: 650.0000 mg | ORAL_TABLET | ORAL | Status: DC | PRN

## 2017-07-24 MED ORDER — OXYCODONE-ACETAMINOPHEN 5-325 MG PO TABS: 2.0000 | ORAL_TABLET | ORAL | Status: DC | PRN

## 2017-07-24 MED ORDER — LIDOCAINE HCL (PF) 1 % IJ SOLN
INTRAMUSCULAR | Status: DC | PRN
  Administered 2017-07-24 (×2): 4 mL

## 2017-07-24 MED ORDER — LACTATED RINGERS IV SOLN
INTRAVENOUS | Status: DC
  Administered 2017-07-24 (×4): via INTRAVENOUS

## 2017-07-24 MED ORDER — OXYCODONE-ACETAMINOPHEN 5-325 MG PO TABS
1.0000 | ORAL_TABLET | Freq: Four times a day (QID) | ORAL | Status: DC | PRN
  Administered 2017-07-24 – 2017-07-25 (×3): 1 via ORAL
  Filled 2017-07-24 (×3): qty 1

## 2017-07-24 MED ORDER — FENTANYL CITRATE (PF) 100 MCG/2ML IJ SOLN: 50.0000 ug | INTRAMUSCULAR | Status: DC | PRN

## 2017-07-24 MED ORDER — FENTANYL 2.5 MCG/ML BUPIVACAINE 1/10 % EPIDURAL INFUSION (WH - ANES)
14.0000 mL/h | INTRAMUSCULAR | Status: DC | PRN
  Administered 2017-07-24: 14 mL/h via EPIDURAL

## 2017-07-24 MED ORDER — DIBUCAINE 1 % RE OINT
1.0000 "application " | TOPICAL_OINTMENT | RECTAL | Status: DC | PRN
  Administered 2017-07-24: 1 via RECTAL
  Filled 2017-07-24: qty 28

## 2017-07-24 MED ORDER — FENTANYL 2.5 MCG/ML BUPIVACAINE 1/10 % EPIDURAL INFUSION (WH - ANES)
14.0000 mL/h | INTRAMUSCULAR | Status: DC | PRN
  Filled 2017-07-24: qty 100

## 2017-07-24 MED ORDER — OXYTOCIN 40 UNITS IN LACTATED RINGERS INFUSION - SIMPLE MED
1.0000 m[IU]/min | INTRAVENOUS | Status: DC
Start: 2017-07-24 — End: 2017-07-24
  Administered 2017-07-24: 2 m[IU]/min via INTRAVENOUS
  Filled 2017-07-24: qty 1000

## 2017-07-24 MED ORDER — OXYTOCIN 40 UNITS IN LACTATED RINGERS INFUSION - SIMPLE MED: 2.5000 [IU]/h | INTRAVENOUS | Status: DC

## 2017-07-24 MED ORDER — LACTATED RINGERS IV SOLN
500.0000 mL | INTRAVENOUS | Status: DC | PRN
  Administered 2017-07-24: 500 mL via INTRAVENOUS

## 2017-07-24 MED ORDER — ONDANSETRON HCL 4 MG/2ML IJ SOLN
4.0000 mg | Freq: Four times a day (QID) | INTRAMUSCULAR | Status: DC | PRN
  Administered 2017-07-24: 4 mg via INTRAVENOUS
  Filled 2017-07-24: qty 2

## 2017-07-24 MED ORDER — TETANUS-DIPHTH-ACELL PERTUSSIS 5-2.5-18.5 LF-MCG/0.5 IM SUSP
0.5000 mL | Freq: Once | INTRAMUSCULAR | Status: AC
  Administered 2017-07-25: 0.5 mL via INTRAMUSCULAR
  Filled 2017-07-24: qty 0.5

## 2017-07-24 MED ORDER — LACTATED RINGERS IV SOLN
500.0000 mL | Freq: Once | INTRAVENOUS | Status: AC
  Administered 2017-07-24: 250 mL via INTRAVENOUS

## 2017-07-24 MED ORDER — ONDANSETRON HCL 4 MG PO TABS: 4.0000 mg | ORAL_TABLET | ORAL | Status: DC | PRN

## 2017-07-24 MED ORDER — BENZOCAINE-MENTHOL 20-0.5 % EX AERO
1.0000 "application " | INHALATION_SPRAY | CUTANEOUS | Status: DC | PRN
  Administered 2017-07-24: 1 via TOPICAL
  Filled 2017-07-24: qty 56

## 2017-07-24 MED ORDER — SOD CITRATE-CITRIC ACID 500-334 MG/5ML PO SOLN: 30.0000 mL | ORAL | Status: DC | PRN

## 2017-07-24 MED ORDER — TERBUTALINE SULFATE 1 MG/ML IJ SOLN
0.2500 mg | Freq: Once | INTRAMUSCULAR | Status: DC | PRN
  Filled 2017-07-24: qty 1

## 2017-07-24 MED ORDER — DIPHENHYDRAMINE HCL 50 MG/ML IJ SOLN
12.5000 mg | INTRAMUSCULAR | Status: DC | PRN
  Filled 2017-07-24: qty 1

## 2017-07-24 MED ORDER — DIPHENHYDRAMINE HCL 25 MG PO CAPS: 25.0000 mg | ORAL_CAPSULE | Freq: Four times a day (QID) | ORAL | Status: DC | PRN

## 2017-07-24 MED ORDER — SENNOSIDES-DOCUSATE SODIUM 8.6-50 MG PO TABS
2.0000 | ORAL_TABLET | ORAL | Status: DC
  Administered 2017-07-24: 2 via ORAL
  Filled 2017-07-24 (×2): qty 2

## 2017-07-24 MED ORDER — OXYTOCIN BOLUS FROM INFUSION
500.0000 mL | Freq: Once | INTRAVENOUS | Status: AC
  Administered 2017-07-24: 500 mL via INTRAVENOUS

## 2017-07-24 MED ORDER — WITCH HAZEL-GLYCERIN EX PADS
1.0000 "application " | MEDICATED_PAD | CUTANEOUS | Status: DC | PRN
  Administered 2017-07-24: 1 via TOPICAL

## 2017-07-24 MED ORDER — SIMETHICONE 80 MG PO CHEW: 80.0000 mg | CHEWABLE_TABLET | ORAL | Status: DC | PRN

## 2017-07-24 NOTE — Progress Notes (Signed)
Vitals:   07/24/17 0630 07/24/17 0700  BP: 104/61   Pulse: 66   Resp:  18  Temp:  98 F (36.7 C)  SpO2:     Comfortable w/epidural.  Ctx spaced out some after epidural.  FHR Cat 1. Discussed pitocin augmentation.  Pt states she has never had to have pitocin, wants to give it a little more time to see if ctx will get closer on their own. Open to AROM.

## 2017-07-24 NOTE — Progress Notes (Signed)
At 1130, 1 minute after SVD, pt c/o " unable to take deep breath and chest pain"- BP, resp rate, and O2 sat normal. MD aware and at bedside, RROB/ charge nurse at bedside. Benadryl at bedside. Level of epidural below nipple line. 1133 pt feeling better, able to take a deep breath without difficulty, BP, resp rate and O2 sat within normal limits. MD aware. 1145 pt feeling good, no dyspnea, vital signs normal.

## 2017-07-24 NOTE — Anesthesia Pain Management Evaluation Note (Signed)
  CRNA Pain Management Visit Note  Patient: Jillian Obrien, 31 y.o., female  "Hello I am a [facility] of the anesthesia team at [facility]. We have an anesthesia team available at all times to provide care throughout the hospital, including epidural management and anesthesia for C-section. I don't know your plan for the delivery whether it a natural birth, water birth, IV sedation, nitrous supplementation, doula or epidural, but we want to meet your pain goals."   1.Was your pain managed to your expectations on prior hospitalizations?   Yes   2.What is your expectation for pain management during this hospitalization?     Epidural  3.How can we help you reach that goal? Support prn  Record the patient's initial score and the patient's pain goal.   Pain: 0  Pain Goal: 7 [facility] wants you to be able to say your pain was always managed very well.  [facility] Hospital IncWRINKLE,Jillian Obrien 07/24/2017

## 2017-07-24 NOTE — Progress Notes (Signed)
Jillian Obrien is a 31 y.o. [id number] at 3035w1d by LMP admitted for active labor  Subjective: Doing well.  Comfortable with epidural.  Augmenting with pitocin.  No concerns.   Objective: BP (!) 102/53   Pulse 64   Temp 97.8 F (36.6 C) (Oral)   Resp 16   Ht 5\' 1"  (1.549 m)   Wt 70.3 kg (155 lb)   SpO2 99%   BMI 29.29 kg/m  I/O last 3 completed shifts: In: 506.3 [I.V.:506.3] Out: -  Total I/O In: 0.5 [I.V.:0.5] Out: -   FHT:  Baseline 135, moderate variability, +accels, mild late decels x3 that have resolved UC:   regular, every 3-4 minutes SVE:   Dilation: 5 Effacement (%): 80 Station: -2 Exam by:: Drenda FreezeFran, CNM  Labs: Lab Results  Component Value Date   WBC 6.9 07/24/2017   HGB 8.6 (L) 07/24/2017   HCT 26.3 (L) 07/24/2017   MCV 77.4 (L) 07/24/2017   PLT 177 07/24/2017    Assessment / Plan: Spontaneous labor, progressing normally  Labor: Progressing on Pitocin, will continue to increase then AROM if needed Preeclampsia:  N/A Fetal Wellbeing:  Category II Pain Control:  Epidural I/D:  n/a Anticipated MOD:  NSVD  Jillian BeachMary K Chera Slivka, DO PGY-2 07/24/2017, 9:59 AM

## 2017-07-24 NOTE — MAU Note (Signed)
Pt presents to MAU with contractions that started at 2300. Pt states that she has felt LOF since 0000. Pt denies vaginal bleeding. +FM

## 2017-07-24 NOTE — H&P (Signed)
LABOR ADMISSION HISTORY AND PHYSICAL  Jillian Obrien is a 31 y.o. female [id number] with IUP at 4439w1d by LMP sometime in January  And confirmed w/an US per patient presenting for SOL. She reports +FMs, No LOF, no VB, no blurry vision, no headaches, no peripheral edema, and no RUQ pain.  She plans on breast feeding. She requests BTL for birth control.  Dating: By LMP/US in January per patient --->  Estimated Date of Delivery: 07/30/17  Sono:  Not completed for this pregnancy.   Patient has no documented prenatal care for this current pregnancy.  Prenatal History/Complications:  Past Medical History: Past Medical History:  Diagnosis Date  . Medical history non-contributory   . No pertinent past medical history     Past Surgical History: Past Surgical History:  Procedure Laterality Date  . NO PAST SURGERIES      Obstetrical History: OB History    Gravida Para Term Preterm AB Living   8 6 6  0 1 6   SAB TAB Ectopic Multiple Live Births   1 0 0 0 6      Social History: Social History   Social History  . Marital status: Married    Spouse name: N/A  . Number of children: N/A  . Years of education: N/A   Social History Main Topics  . Smoking status: Never Smoker  . Smokeless tobacco: Never Used  . Alcohol use No  . Drug use: No  . Sexual activity: Yes    Birth control/ protection: None   Other Topics Concern  . None   Social History Narrative  . None    Family History: History reviewed. No pertinent family history.  Allergies: No Known Allergies  No prescriptions prior to admission.     Review of Systems   All systems reviewed and negative except as stated in HPI  Blood pressure 126/74, pulse 89, temperature 98.2 F (36.8 C), temperature source Oral, resp. rate 18, unknown if currently breastfeeding. General appearance: alert and cooperative Lungs: normal work of breathing Extremities: Homans sign is negative, no sign of DVT Presentation:  unsure Fetal monitoringBaseline: 140 bpm, Variability: Good {> 6 bpm), Accelerations: Reactive and Decelerations: Absent Uterine activity: Irregular every 4-6 minutes, 60 seconds Dilation: 4.5 Effacement (%): 70 Station: -2 Exam by:: Kyung Baccahristy White, RN    Prenatal labs: ABO, Rh:  A+ Antibody:  neg Rubella: imm RPR:   pend HBsAg:   pend HIV:   pend GBS:   neg GTT:not done  Prenatal Transfer Tool  Maternal Diabetes: unknown Genetic Screening: none Maternal Ultrasounds/Referrals: none  Fetal Ultrasounds or other Referrals:none   Maternal Substance Abuse:  UDS neg Significant Maternal Medications:  no Significant Maternal Lab Results: GBS neg  Results for orders placed or performed during the hospital encounter of 07/24/17 (from the past 24 hour(s))  POCT fern test   Collection Time: 07/24/17  2:36 AM  Result Value Ref Range   POCT Fern Test Negative = intact amniotic membranes   CBC   Collection Time: 07/24/17  2:45 AM  Result Value Ref Range   WBC 6.9 4.0 - 10.5 K/uL   RBC 3.40 (L) 3.87 - 5.11 MIL/uL   Hemoglobin 8.6 (L) 12.0 - 15.0 g/dL   HCT 14.726.3 (L) 82.936.0 - 56.246.0 %   MCV 77.4 (L) 78.0 - 100.0 fL   MCH 25.3 (L) 26.0 - 34.0 pg   MCHC 32.7 30.0 - 36.0 g/dL   RDW 13.015.7 (H) 86.511.5 - 78.415.5 %   Platelets  177 150 - 400 K/uL    Patient Active Problem List   Diagnosis Date Noted  . Normal labor 07/24/2017  . Normal labor and delivery 04/10/2016  . NSVD (normal spontaneous vaginal delivery) 04/10/2016  . Indication for care in labor or delivery 04/10/2014  . Normal delivery 04/10/2014  . GERD without esophagitis 03/23/2014  . Vitamin D deficiency 09/16/2013  . Supervision of pregnancy with grand multiparity 09/05/2013    Assessment: Jillian Obrien is a 31 y.o. [id number] at [redacted]w[redacted]d here for SOL.  #Labor: continue to monitor. #Pain: Epidural upon request By patient #FWB: Cat 1 #ID: [id number]  #MOF: breast #MOC: BTL #Circ:  Unsure of gender  CRESENZO-DISHMAN,Kenli Waldo

## 2017-07-24 NOTE — Anesthesia Procedure Notes (Signed)
Epidural Patient location during procedure: OB  Staffing Anesthesiologist: Massie Cogliano Performed: anesthesiologist   Preanesthetic Checklist Completed: patient identified, pre-op evaluation, timeout performed, IV checked, risks and benefits discussed and monitors and equipment checked  Epidural Patient position: sitting Prep: site prepped and draped and DuraPrep Patient monitoring: heart rate, continuous pulse ox and blood pressure Approach: midline Location: L3-L4 Injection technique: LOR air and LOR saline  Needle:  Needle type: Tuohy  Needle gauge: 17 G Needle length: 9 cm Needle insertion depth: 6 cm Catheter type: closed end flexible Catheter size: 19 Gauge Catheter at skin depth: 11 cm Test dose: negative  Assessment Sensory level: T8 Events: blood not aspirated, injection not painful, no injection resistance, negative IV test and no paresthesia  Additional Notes Reason for block:procedure for pain     

## 2017-07-24 NOTE — Anesthesia Preprocedure Evaluation (Signed)
Anesthesia Evaluation  Patient identified by MRN, date of birth, ID band Patient awake    Reviewed: Allergy & Precautions, H&P , Patient's Chart, lab work & pertinent test results  Airway Mallampati: II  TM Distance: >3 FB Neck ROM: full    Dental no notable dental hx. (+) Teeth Intact   Pulmonary neg pulmonary ROS,    Pulmonary exam normal breath sounds clear to auscultation       Cardiovascular negative cardio ROS Normal cardiovascular exam Rhythm:regular Rate:Normal     Neuro/Psych negative neurological ROS  negative psych ROS   GI/Hepatic Neg liver ROS, GERD  Medicated and Controlled,  Endo/Other  negative endocrine ROS  Renal/GU negative Renal ROS  negative genitourinary   Musculoskeletal   Abdominal   Peds  Hematology  (+) anemia ,   Anesthesia Other Findings   Reproductive/Obstetrics (+) Pregnancy                             Anesthesia Physical  Anesthesia Plan  ASA: II  Anesthesia Plan: Epidural   Post-op Pain Management:    Induction:   PONV Risk Score and Plan:   Airway Management Planned:   Additional Equipment:   Intra-op Plan:   Post-operative Plan:   Informed Consent: I have reviewed the patients History and Physical, chart, labs and discussed the procedure including the risks, benefits and alternatives for the proposed anesthesia with the patient or authorized representative who has indicated his/her understanding and acceptance.     Plan Discussed with:   Anesthesia Plan Comments:         Anesthesia Quick Evaluation

## 2017-07-24 NOTE — Anesthesia Postprocedure Evaluation (Signed)
Anesthesia Post Note  Patient: Jillian Obrien  Procedure(s) Performed: * No procedures listed *     Patient location during evaluation: Mother Baby Anesthesia Type: Epidural Level of consciousness: awake and alert and oriented Pain management: satisfactory to patient Vital Signs Assessment: post-procedure vital signs reviewed and stable Respiratory status: spontaneous breathing and nonlabored ventilation Cardiovascular status: stable Postop Assessment: no headache, no backache, no signs of nausea or vomiting, adequate PO intake and patient able to bend at knees (patient up walking) Anesthetic complications: no    Last Vitals:  Vitals:   07/24/17 1409 07/24/17 1747  BP: (!) 104/56 (!) 101/46  Pulse: 71 72  Resp: 18 18  Temp: 36.8 C 36.7 C  SpO2: 99%     Last Pain:  Vitals:   07/24/17 1747  TempSrc: Oral  PainSc:    Pain Goal:                 Madison HickmanGREGORY,Suhail Peloquin

## 2017-07-24 NOTE — Progress Notes (Signed)
UR chart review completed.  

## 2017-07-24 NOTE — Progress Notes (Signed)
Ctx now spaced out to q 10 minutes. Wants augmentation. Will start pitocin

## 2017-07-25 DIAGNOSIS — D649 Anemia, unspecified: Secondary | ICD-10-CM | POA: Diagnosis present

## 2017-07-25 MED ORDER — FERROUS FUMARATE 324 (106 FE) MG PO TABS: 1.0000 | ORAL_TABLET | Freq: Two times a day (BID) | ORAL | 3 refills | Status: DC

## 2017-07-25 MED ORDER — OXYCODONE-ACETAMINOPHEN 5-325 MG PO TABS
1.0000 | ORAL_TABLET | ORAL | Status: DC | PRN
  Administered 2017-07-25 – 2017-07-26 (×4): 1 via ORAL
  Filled 2017-07-25 (×4): qty 1

## 2017-07-25 MED ORDER — SENNOSIDES-DOCUSATE SODIUM 8.6-50 MG PO TABS: 2.0000 | ORAL_TABLET | Freq: Every evening | ORAL | 0 refills | Status: DC | PRN

## 2017-07-25 MED ORDER — IBUPROFEN 600 MG PO TABS: 600.0000 mg | ORAL_TABLET | Freq: Four times a day (QID) | ORAL | 0 refills | Status: DC | PRN

## 2017-07-25 NOTE — Lactation Note (Signed)
This note was copied from a baby's chart. Lactation Consultation Note  Patient Name: Jillian Obrien Reason for consult: Initial assessment   P7, Baby 25 hours old.  Ex BF 2 years with each child. Mother denies questions or concerns and states breastfeeding is going well. Briefly discussed basics and provided mother w/ manual pump. Mom encouraged to feed baby 8-12 times/24 hours and with feeding cues.  Mom made aware of O/P services, breastfeeding support groups, community resources, and our phone # for post-discharge questions.     Maternal Data Has patient been taught Hand Expression?: Yes Does the patient have breastfeeding experience prior to this delivery?: Yes  Feeding Feeding Type: Breast Fed Length of feed: 20 min  LATCH Score Latch: Grasps breast easily, tongue down, lips flanged, rhythmical sucking.  Audible Swallowing: Spontaneous and intermittent  Type of Nipple: Everted at rest and after stimulation  Comfort (Breast/Nipple): Soft / non-tender  Hold (Positioning): No assistance needed to correctly position infant at breast.  LATCH Score: 10  Interventions    Lactation Tools Discussed/Used     Consult Status Consult Status: Complete    Hardie PulleyBerkelhammer, Ruth Boschen Obrien, 12:30 PM

## 2017-07-25 NOTE — Clinical Social Work Maternal (Signed)
  CLINICAL SOCIAL WORK MATERNAL/CHILD NOTE  Patient Details  Name: Jillian Obrien MRN: [id number] Date of Birth: 1986-07-10  Date:  07/25/2017  Clinical Social Worker Initiating Note:  Ferdinand Lango Janis Cuffe, MSW, LCSW-A  Date/ Time Initiated:  07/25/17/1308     Child's Name:  [facility]    Legal Guardian:  Other (Comment) (Not established by court system; [name] and FOB parent collectively )   Need for Interpreter:  None   Date of Referral:  07/24/17     Reason for Referral:  Late or No Prenatal Care    Referral Source:  RN   Address:  [address] [facility] [address] [place] [address]  Phone number:  [phone]   Household Members:  Self, Minor Children, Spouse   Natural Supports (not living in the home):  Other (Comment) ([name] reports no additional supports outside the home)   Professional Supports: None   Employment: Unemployed   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   Strengths:  Ability to meet basic needs , Home prepared for child , Compliance with medical plan , Pediatrician chosen  ([facility])   Risk Factors/Current Problems:  None   Cognitive State:  Able to Concentrate , Alert , Insightful    Mood/Affect:  Flat    CSW Assessment: CSW met with [name] at bedside to complete assessment for consult regarding NPNC. Upon this writers arrival, [name] was sitting in bed watching TV while baby was asleep in basinet. This Probation officer explained role and reasoning for visit. [name] verbalized understanding. This Probation officer inquired about reasoning for [facility]. [name] noted she just did not have time for it as she has 6 other kids at home and her husband works so she didn't make time for it. This Probation officer inquired about barriers to attending follow-up appointments for she, baby, and her other children after discharge. [name] notes  "of course we will follow-up now that she is born. I just did not have time before she arrived". CSW explained the importance of medical follow-up and seeking community resources if barriers present. [name] verbalized understanding. This Probation officer informed [name] of the UDS and CDS taken of baby due to [facility]. This Probation officer informed [name] tat results are not back yet; however, if they are positive or illegal substance, a DSS report will be warranted. [name] verbalized understanding noting she does not use substance. CSW assessed [name] for any further psychosocial needs. [name] denies any additional needs. CSW thanked [name] for her time.  CSW will continue to follow pending UDS and CDS results.   CSW Plan/Description:  Information/Referral to [employer] , No Further Intervention Required/No Barriers to Discharge, Patient/Family Education     Water quality scientist, MSW, [facility]  Office: [phone]

## 2017-07-25 NOTE — Discharge Summary (Signed)
OB Discharge Summary     Patient Name: Jillian Obrien DOB: 08/21/1986 MRN: [id number]  Date of admission: 07/24/2017 Delivering MD: KEY, Corrie Dandy K   Date of discharge: 07/25/2017  Admitting diagnosis: 39 WEEKS CTX Intrauterine pregnancy: [redacted]w[redacted]d     Secondary diagnosis:  Principal Problem:   SVD (spontaneous vaginal delivery) Active Problems:   No prenatal care in current pregnancy Anemia  Additional problems:  Patient Active Problem List   Diagnosis Date Noted  . SVD (spontaneous vaginal delivery) 07/24/2017  . No prenatal care in current pregnancy 07/24/2017  . Indication for care in labor or delivery 04/10/2014  . GERD without esophagitis 03/23/2014  . Vitamin D deficiency 09/16/2013        Discharge diagnosis: Term Pregnancy Delivered and Anemia                                                                                                Post partum procedures:None  Augmentation: AROM and Pitocin  Complications: None  Hospital course:  Onset of Labor With Vaginal Delivery     31 y.o. yo [id number] at [redacted]w[redacted]d was admitted in Active Labor on 07/24/2017. Patient had an uncomplicated labor course as follows:  Membrane Rupture Time/Date: 10:50 AM ,07/24/2017   Intrapartum Procedures: Episiotomy: None [1]                                         Lacerations:  None [1]  Patient had a delivery of a Viable infant. 07/24/2017  Information for the patient's newborn:  Shatavia, Santor [[id number]]  Delivery Method: Vaginal, Spontaneous Delivery (Filed from Delivery Summary)    Pateint had an uncomplicated postpartum course.  She is ambulating, tolerating a regular diet, passing flatus, and urinating well. Patient is discharged home in stable condition on 07/25/17 pending SW consult. She desires early discharge.   Physical exam  Vitals:   07/24/17 1409 07/24/17 1747 07/25/17 0138 07/25/17 0540  BP: (!) 104/56 (!) 101/46 (!) 104/58 (!) 106/59  Pulse: 71 72 70 68  Resp: Temp: 98.2 F (36.8 C) 98.1 F (36.7 C) 98 F (36.7 C) 98.1 F (36.7 C)  TempSrc: Oral Oral Oral Oral  SpO2: 99%  99% 99%  Weight:      Height:       General: alert, cooperative and no distress Lochia: appropriate Uterine Fundus: firm Incision: N/A DVT Evaluation: No evidence of DVT seen on physical exam. Labs: Lab Results  Component Value Date   WBC 6.9 07/24/2017   HGB 8.6 (L) 07/24/2017   HCT 26.3 (L) 07/24/2017   MCV 77.4 (L) 07/24/2017   PLT 177 07/24/2017   No flowsheet data found.  Discharge instruction: per After Visit Summary and "Baby and Me Booklet".  After visit meds:  Allergies as of 07/25/2017   No Known Allergies     Medication List    TAKE these medications   Ferrous Fumarate 324 (106 Fe) MG Tabs tablet Commonly known as:  HEMOCYTE - 106 mg FE Take 1 tablet (106 mg of iron total) by mouth 2 (two) times daily.   ibuprofen 600 MG tablet Commonly known as:  ADVIL,MOTRIN Take 1 tablet (600 mg total) by mouth every 6 (six) hours as needed for moderate pain or cramping.   prenatal multivitamin Tabs tablet Take 1 tablet by mouth daily at 12 noon.   senna-docusate 8.6-50 MG tablet Commonly known as:  Senokot-S Take 2 tablets by mouth at bedtime as needed for mild constipation.            Discharge Care Instructions        Start     Ordered   07/25/17 0000  ibuprofen (ADVIL,MOTRIN) 600 MG tablet  Every 6 hours PRN     07/25/17 0806   07/25/17 0000  senna-docusate (SENOKOT-S) 8.6-50 MG tablet  At bedtime PRN     07/25/17 0806   07/25/17 0000  Ferrous Fumarate (HEMOCYTE - 106 MG FE) 324 (106 Fe) MG TABS tablet  2 times daily    Question:  Supervising Provider  Answer:  Hermina StaggersRVIN, MICHAEL L   07/25/17 [id number]     Patient is not discharged prior to social work consult.   Diet: routine diet  Activity: Advance as tolerated. Pelvic rest for 6 weeks.   Outpatient follow up: 2 weeks for BTL papers/pre-op and 4-6 weeks for postpartum visit (inbox msg  sent to [facility] as pt has been at that office for prenatal care with prev preg)  Follow up Appt:No future appointments. Follow up Visit:No Follow-up on file.  Postpartum contraception: Tubal Ligation- interval  Newborn Data: Live born female  Birth Weight: 6 lb 15.8 oz (3170 g) APGAR: 8, 9  Baby Feeding: Breast Disposition:home with mother   07/25/2017 Larene BeachMary K Key, DO PGY-2  CNM attestation I have seen and examined this patient and agree with above documentation in the resident's note.   Marlette Cyndie Chimeguyen is a 31 y.o. [id number] s/p SVD.   Pain is well controlled.  Plan for birth control is bilateral tubal ligation.  Method of Feeding: breast  PE:  BP (!) 106/59 (BP Location: Right Arm)   Pulse 68   Temp 98.1 F (36.7 C) (Oral)   Resp 18   Ht 5\' 1"  (1.549 m)   Wt 70.3 kg (155 lb)   SpO2 99%   Breastfeeding? Unknown   BMI 29.29 kg/m  Fundus firm   Recent Labs  07/24/17 0245  HGB 8.6*  HCT 26.3*     Plan: discharge today - postpartum care discussed - f/u clinic in 2 weeks for preop BTL visit and to sign BTL papers, then plan for 6 wk procedure   Cam HaiSHAW, Angeliz Settlemyre, CNM 8:57 AM 07/25/2017

## 2017-07-26 MED ORDER — PHENYLEPH-SHARK LIV OIL-MO-PET 0.25-3-14-71.9 % RE OINT
TOPICAL_OINTMENT | Freq: Four times a day (QID) | RECTAL | Status: DC | PRN
  Filled 2017-07-26: qty 28

## 2017-07-26 MED ORDER — DOCUSATE SODIUM 100 MG PO CAPS: 100.0000 mg | ORAL_CAPSULE | Freq: Two times a day (BID) | ORAL | 2 refills | Status: AC

## 2017-07-26 MED ORDER — POLYETHYLENE GLYCOL 3350 17 GM/SCOOP PO POWD: 17.0000 g | Freq: Every day | ORAL | 1 refills | Status: DC

## 2017-07-26 MED ORDER — PHENYLEPH-SHARK LIV OIL-MO-PET 0.25-3-14-71.9 % RE OINT: TOPICAL_OINTMENT | Freq: Four times a day (QID) | RECTAL | 0 refills | Status: DC | PRN

## 2017-07-26 MED ORDER — HYDROCORTISONE 2.5 % RE CREA
TOPICAL_CREAM | Freq: Four times a day (QID) | RECTAL | Status: DC
  Administered 2017-07-26 (×2): via RECTAL
  Filled 2017-07-26: qty 28.35

## 2017-07-26 NOTE — Discharge Summary (Signed)
OB Discharge Summary     Patient Name: Jillian Obrien DOB: November 20, 1985 MRN: [id number]  Date of admission: 07/24/2017 Delivering MD: KEY, Jillian DandyMARY Obrien   Date of discharge: 07/26/2017  Admitting diagnosis: 39 WEEKS CTX Intrauterine pregnancy: 2969w1d     Secondary diagnosis:  Principal Problem:   SVD (spontaneous vaginal delivery) Active Problems:   No prenatal care in current pregnancy   Anemia   Additional problems:  Patient Active Problem List   Diagnosis Date Noted  . Anemia 07/25/2017  . SVD (spontaneous vaginal delivery) 07/24/2017  . No prenatal care in current pregnancy 07/24/2017  . Indication for care in labor or delivery 04/10/2014  . GERD without esophagitis 03/23/2014  . Vitamin D deficiency 09/16/2013       Discharge diagnosis: Term Pregnancy Delivered and Anemia                                                                                                Post partum procedures:None  Augmentation: AROM and Pitocin  Complications: None  Hospital course:  Onset of Labor With Vaginal Delivery     31 y.o. yo [id number] at 3969w1d was admitted in Active Labor on 07/24/2017. Patient had an uncomplicated labor course as follows:  Membrane Rupture Time/Date: 10:50 AM ,07/24/2017   Intrapartum Procedures: Episiotomy: None [1]                                         Lacerations:  None [1]  Patient had a delivery of a Viable infant. 07/24/2017  Information for the patient's newborn:  Jillian Obrien, Girl Jillian Obrien [[id number]][[id number]]  Delivery Method: Vaginal, Spontaneous Delivery (Filed from Delivery Summary)   Pateint had an uncomplicated postpartum course.  She is ambulating, tolerating a regular diet, passing flatus, and urinating well. Patient is discharged home in stable condition on 07/26/17.   Physical exam  Vitals:   07/25/17 0138 07/25/17 0540 07/25/17 1800 07/26/17 0500  BP: (!) 104/58 (!) 106/59 (!) 112/58 (!) 110/52  Pulse: 70 68 68 (!) 57  Resp: 18 18 18 20   Temp: 98 F (36.7  C) 98.1 F (36.7 C) 98.1 F (36.7 C) 98.4 F (36.9 C)  TempSrc: Oral Oral Oral Oral  SpO2: 99% 99% 99%   Weight:      Height:       General: alert, cooperative and no distress Lochia: appropriate Uterine Fundus: firm Incision: N/A DVT Evaluation: No evidence of DVT seen on physical exam. Labs: Lab Results  Component Value Date   WBC 6.9 07/24/2017   HGB 8.6 (L) 07/24/2017   HCT 26.3 (L) 07/24/2017   MCV 77.4 (L) 07/24/2017   PLT 177 07/24/2017   No flowsheet data found.  Discharge instruction: per After Visit Summary and "Baby and Me Booklet".  After visit meds:  Allergies as of 07/26/2017   No Known Allergies     Medication List    TAKE these medications   docusate sodium 100 MG capsule Commonly known as:  COLACE Take 1 capsule (  100 mg total) by mouth 2 (two) times daily.   Ferrous Fumarate 324 (106 Fe) MG Tabs tablet Commonly known as:  HEMOCYTE - 106 mg FE Take 1 tablet (106 mg of iron total) by mouth 2 (two) times daily.   ibuprofen 600 MG tablet Commonly known as:  ADVIL,MOTRIN Take 1 tablet (600 mg total) by mouth every 6 (six) hours as needed for moderate pain or cramping.   phenylephrine-shark liver oil-mineral oil-petrolatum 0.25-3-14-71.9 % rectal ointment Commonly known as:  PREPARATION H Place rectally 4 (four) times daily as needed for hemorrhoids.   polyethylene glycol powder powder Commonly known as:  GLYCOLAX/MIRALAX Take 17 g by mouth daily.   prenatal multivitamin Tabs tablet Take 1 tablet by mouth daily at 12 noon.   senna-docusate 8.6-50 MG tablet Commonly known as:  Senokot-S Take 2 tablets by mouth at bedtime as needed for mild constipation.            Discharge Care Instructions        Start     Ordered   07/26/17 0000  phenylephrine-shark liver oil-mineral oil-petrolatum (PREPARATION H) 0.25-3-14-71.9 % rectal ointment  4 times daily PRN     07/26/17 0625   07/26/17 0000  docusate sodium (COLACE) 100 MG capsule  2 times  daily     07/26/17 0625   07/26/17 0000  polyethylene glycol powder (GLYCOLAX/MIRALAX) powder  Daily     07/26/17 0625   07/26/17 0000  Discharge patient    Question Answer Comment  Discharge disposition 01-Home or Self Care   Discharge patient date 07/26/2017      07/26/17 0626   07/25/17 0000  ibuprofen (ADVIL,MOTRIN) 600 MG tablet  Every 6 hours PRN     07/25/17 0806   07/25/17 0000  senna-docusate (SENOKOT-S) 8.6-50 MG tablet  At bedtime PRN     07/25/17 0806   07/25/17 0000  Ferrous Fumarate (HEMOCYTE - 106 MG FE) 324 (106 Fe) MG TABS tablet  2 times daily    Question:  Supervising Provider  Answer:  Jillian Obrien   07/25/17 4540     Patient is not discharged prior to social work consult.   Diet: routine diet  Activity: Advance as tolerated. Pelvic rest for 6 weeks.   Outpatient follow up: 2 weeks for BTL papers/pre-op and 4-6 weeks for postpartum visit (inbox msg sent to [facility] as pt has been at that office for prenatal care with prev preg)  Postpartum contraception: Tubal Ligation- interval  Newborn Data: Live born female  Birth Weight: 6 lb 15.8 oz (3170 g) APGAR: 8, 9  Baby Feeding: Breast Disposition:home with mother   Jillian Pear, MD 6:26 AM 07/26/2017

## 2017-07-26 NOTE — Progress Notes (Signed)
Reported to Jillian EbbsJulie Degele Md patients paint and masses in perineum.

## 2017-07-26 NOTE — Progress Notes (Signed)
Left message with Maralyn SagoSarah in OR with Md Phone to change order for rectal cream ordered. Pharmacy does not carry prescribed preperation H. Md is surgery- waiting for response.

## 2017-07-26 NOTE — Discharge Instructions (Signed)

## 2017-08-11 ENCOUNTER — Ambulatory Visit: Payer: Medicaid Other | Admitting: Obstetrics and Gynecology

## 2018-11-17 NOTE — L&D Delivery Note (Addendum)
Patient: Jillian Obrien MRN: [id number]  GBS status: unknown, IAP given: not indicated  Patient is a 33 y.o. now L3T3428 s/p NSVD at [redacted]w[redacted]d, who was admitted for SOL. AROM 1h 9m prior to delivery with clear fluid.    Delivery Note At 8:26 AM a viable female was delivered via Vaginal, Vacuum (Extractor) (Presentation: OP; cephalic).  APGAR: 8, 9; weight 7 lb 1.4 oz (3215 g).   Placenta status: intact, 3-vessel cord.  Cord:  with the following complications: none.    Anesthesia: None  Episiotomy: None Lacerations: None Suture Repair: None Est. Blood Loss (mL): 329  Mom to postpartum.  Baby to Couplet care / Skin to Skin.  [name] 12/27/2018, 10:41 AM  Head delivered OP. Kiwi vacuum used to assist with delivery of the head due to recurrent prolonged decelerations and poor maternal effort.  No nuchal cord present. Shoulder and body delivered in usual fashion. Infant with spontaneous cry, placed on mother's abdomen, dried and bulb suctioned. Cord clamped x 2 after 1-minute delay, and cut by family member. Cord blood drawn. Placenta delivered spontaneously with gentle cord traction. Fundus firm with massage and Pitocin. Perineum inspected and found to have no lacerations, with good hemostasis achieved.  OB Attending I was present in the room for the above procedure.  Nettie Elm, MD

## 2018-12-27 ENCOUNTER — Inpatient Hospital Stay (HOSPITAL_COMMUNITY)
Admission: AD | Admit: 2018-12-27 | Discharge: 2018-12-29 | DRG: 806 | Disposition: A | Discharge status: Home / Self Care | Payer: Medicaid Other | Attending: Obstetrics and Gynecology | Admitting: Obstetrics and Gynecology

## 2018-12-27 ENCOUNTER — Encounter (HOSPITAL_COMMUNITY): Payer: Self-pay

## 2018-12-27 DIAGNOSIS — Z3A38 38 weeks gestation of pregnancy: Secondary | ICD-10-CM

## 2018-12-27 DIAGNOSIS — Z3483 Encounter for supervision of other normal pregnancy, third trimester: Secondary | ICD-10-CM | POA: Diagnosis present

## 2018-12-27 DIAGNOSIS — O9902 Anemia complicating childbirth: Principal | ICD-10-CM | POA: Diagnosis present

## 2018-12-27 DIAGNOSIS — O0930 Supervision of pregnancy with insufficient antenatal care, unspecified trimester: Secondary | ICD-10-CM

## 2018-12-27 DIAGNOSIS — Z23 Encounter for immunization: Secondary | ICD-10-CM | POA: Diagnosis not present

## 2018-12-27 DIAGNOSIS — D649 Anemia, unspecified: Secondary | ICD-10-CM | POA: Diagnosis present

## 2018-12-27 DIAGNOSIS — O2243 Hemorrhoids in pregnancy, third trimester: Secondary | ICD-10-CM | POA: Diagnosis present

## 2018-12-27 LAB — TYPE AND SCREEN
ABO/RH(D): A POS
Antibody Screen: NEGATIVE

## 2018-12-27 LAB — CBC
HCT: 29 % — ABNORMAL LOW (ref 36.0–46.0)
Hemoglobin: 8.7 g/dL — ABNORMAL LOW (ref 12.0–15.0)
MCH: 24 pg — AB (ref 26.0–34.0)
MCHC: 30 g/dL (ref 30.0–36.0)
MCV: 80.1 fL (ref 80.0–100.0)
Platelets: 150 10*3/uL (ref 150–400)
RBC: 3.62 MIL/uL — AB (ref 3.87–5.11)
RDW: 15 % (ref 11.5–15.5)
WBC: 7.7 10*3/uL (ref 4.0–10.5)
nRBC: 0.5 % — ABNORMAL HIGH (ref 0.0–0.2)

## 2018-12-27 LAB — HEPATITIS B SURFACE ANTIGEN: Hepatitis B Surface Ag: NEGATIVE

## 2018-12-27 LAB — RPR: RPR Ser Ql: NONREACTIVE

## 2018-12-27 LAB — RAPID HIV SCREEN (HIV 1/2 AB+AG)
HIV 1/2 ANTIBODIES: NONREACTIVE
HIV-1 P24 Antigen - HIV24: NONREACTIVE

## 2018-12-27 MED ORDER — CARBOPROST TROMETHAMINE 250 MCG/ML IM SOLN
INTRAMUSCULAR | Status: AC
  Filled 2018-12-27: qty 1

## 2018-12-27 MED ORDER — SOD CITRATE-CITRIC ACID 500-334 MG/5ML PO SOLN: 30.0000 mL | ORAL | Status: DC | PRN

## 2018-12-27 MED ORDER — OXYTOCIN 40 UNITS IN NORMAL SALINE INFUSION - SIMPLE MED
2.5000 [IU]/h | INTRAVENOUS | Status: DC
  Filled 2018-12-27: qty 1000

## 2018-12-27 MED ORDER — ONDANSETRON HCL 4 MG/2ML IJ SOLN: 4.0000 mg | INTRAMUSCULAR | Status: DC | PRN

## 2018-12-27 MED ORDER — OXYCODONE-ACETAMINOPHEN 5-325 MG PO TABS: 2.0000 | ORAL_TABLET | ORAL | Status: DC | PRN

## 2018-12-27 MED ORDER — ONDANSETRON HCL 4 MG/2ML IJ SOLN: 4.0000 mg | Freq: Four times a day (QID) | INTRAMUSCULAR | Status: DC | PRN

## 2018-12-27 MED ORDER — ONDANSETRON HCL 4 MG PO TABS: 4.0000 mg | ORAL_TABLET | ORAL | Status: DC | PRN

## 2018-12-27 MED ORDER — TRANEXAMIC ACID-NACL 1000-0.7 MG/100ML-% IV SOLN
INTRAVENOUS | Status: AC
  Filled 2018-12-27: qty 100

## 2018-12-27 MED ORDER — ACETAMINOPHEN 325 MG PO TABS: 650.0000 mg | ORAL_TABLET | ORAL | Status: DC | PRN

## 2018-12-27 MED ORDER — DIBUCAINE 1 % RE OINT
1.0000 "application " | TOPICAL_OINTMENT | RECTAL | Status: DC | PRN
  Administered 2018-12-27: 1 via RECTAL
  Filled 2018-12-27: qty 28

## 2018-12-27 MED ORDER — LACTATED RINGERS IV SOLN
INTRAVENOUS | Status: DC
  Administered 2018-12-27: 07:00:00 via INTRAVENOUS

## 2018-12-27 MED ORDER — SENNOSIDES-DOCUSATE SODIUM 8.6-50 MG PO TABS
2.0000 | ORAL_TABLET | ORAL | Status: DC
  Administered 2018-12-28 (×2): 2 via ORAL
  Filled 2018-12-27 (×2): qty 2

## 2018-12-27 MED ORDER — FLEET ENEMA 7-19 GM/118ML RE ENEM: 1.0000 | ENEMA | RECTAL | Status: DC | PRN

## 2018-12-27 MED ORDER — IBUPROFEN 600 MG PO TABS
600.0000 mg | ORAL_TABLET | Freq: Four times a day (QID) | ORAL | Status: DC
  Administered 2018-12-27 – 2018-12-29 (×8): 600 mg via ORAL
  Filled 2018-12-27 (×8): qty 1

## 2018-12-27 MED ORDER — COCONUT OIL OIL: 1.0000 "application " | TOPICAL_OIL | Status: DC | PRN

## 2018-12-27 MED ORDER — OXYCODONE-ACETAMINOPHEN 5-325 MG PO TABS
1.0000 | ORAL_TABLET | ORAL | Status: DC | PRN
  Administered 2018-12-27: 1 via ORAL
  Filled 2018-12-27: qty 1

## 2018-12-27 MED ORDER — PRENATAL MULTIVITAMIN CH
1.0000 | ORAL_TABLET | Freq: Every day | ORAL | Status: DC
  Administered 2018-12-27 – 2018-12-29 (×3): 1 via ORAL
  Filled 2018-12-27 (×3): qty 1

## 2018-12-27 MED ORDER — DIPHENHYDRAMINE HCL 25 MG PO CAPS: 25.0000 mg | ORAL_CAPSULE | Freq: Four times a day (QID) | ORAL | Status: DC | PRN

## 2018-12-27 MED ORDER — LACTATED RINGERS IV SOLN: 500.0000 mL | INTRAVENOUS | Status: DC | PRN

## 2018-12-27 MED ORDER — TETANUS-DIPHTH-ACELL PERTUSSIS 5-2.5-18.5 LF-MCG/0.5 IM SUSP
0.5000 mL | Freq: Once | INTRAMUSCULAR | Status: AC
  Administered 2018-12-29: 0.5 mL via INTRAMUSCULAR
  Filled 2018-12-27: qty 0.5

## 2018-12-27 MED ORDER — OXYTOCIN 40 UNITS IN NORMAL SALINE INFUSION - SIMPLE MED
1.0000 m[IU]/min | INTRAVENOUS | Status: DC
  Administered 2018-12-27: 2 m[IU]/min via INTRAVENOUS

## 2018-12-27 MED ORDER — OXYTOCIN BOLUS FROM INFUSION
500.0000 mL | Freq: Once | INTRAVENOUS | Status: AC
  Administered 2018-12-27: 500 mL via INTRAVENOUS

## 2018-12-27 MED ORDER — MISOPROSTOL 200 MCG PO TABS
ORAL_TABLET | ORAL | Status: AC
  Filled 2018-12-27: qty 5

## 2018-12-27 MED ORDER — FENTANYL CITRATE (PF) 100 MCG/2ML IJ SOLN
INTRAMUSCULAR | Status: AC
  Filled 2018-12-27: qty 4

## 2018-12-27 MED ORDER — TERBUTALINE SULFATE 1 MG/ML IJ SOLN
0.2500 mg | Freq: Once | INTRAMUSCULAR | Status: DC | PRN
  Filled 2018-12-27: qty 1

## 2018-12-27 MED ORDER — LIDOCAINE HCL (PF) 1 % IJ SOLN
30.0000 mL | INTRAMUSCULAR | Status: DC | PRN
  Filled 2018-12-27: qty 30

## 2018-12-27 MED ORDER — MEASLES, MUMPS & RUBELLA VAC IJ SOLR
0.5000 mL | Freq: Once | INTRAMUSCULAR | Status: DC
  Filled 2018-12-27: qty 0.5

## 2018-12-27 MED ORDER — OXYTOCIN 40 UNITS IN NORMAL SALINE INFUSION - SIMPLE MED
INTRAVENOUS | Status: AC
  Filled 2018-12-27: qty 1000

## 2018-12-27 MED ORDER — INFLUENZA VAC SPLIT QUAD 0.5 ML IM SUSY
0.5000 mL | PREFILLED_SYRINGE | INTRAMUSCULAR | Status: AC
  Administered 2018-12-29: 0.5 mL via INTRAMUSCULAR
  Filled 2018-12-27: qty 0.5

## 2018-12-27 MED ORDER — ACETAMINOPHEN 325 MG PO TABS
650.0000 mg | ORAL_TABLET | ORAL | Status: DC | PRN
  Administered 2018-12-27: 650 mg via ORAL
  Filled 2018-12-27: qty 2

## 2018-12-27 MED ORDER — IBUPROFEN 600 MG PO TABS
600.0000 mg | ORAL_TABLET | Freq: Four times a day (QID) | ORAL | Status: DC | PRN
  Administered 2018-12-27: 600 mg via ORAL
  Filled 2018-12-27: qty 1

## 2018-12-27 MED ORDER — METHYLERGONOVINE MALEATE 0.2 MG/ML IJ SOLN
INTRAMUSCULAR | Status: AC
  Filled 2018-12-27: qty 1

## 2018-12-27 MED ORDER — WITCH HAZEL-GLYCERIN EX PADS
1.0000 "application " | MEDICATED_PAD | CUTANEOUS | Status: DC | PRN
  Administered 2018-12-27: 1 via TOPICAL

## 2018-12-27 MED ORDER — SIMETHICONE 80 MG PO CHEW: 80.0000 mg | CHEWABLE_TABLET | ORAL | Status: DC | PRN

## 2018-12-27 MED ORDER — BENZOCAINE-MENTHOL 20-0.5 % EX AERO
1.0000 "application " | INHALATION_SPRAY | CUTANEOUS | Status: DC | PRN
  Administered 2018-12-27: 1 via TOPICAL
  Filled 2018-12-27: qty 56

## 2018-12-27 NOTE — H&P (Signed)
LABOR AND DELIVERY ADMISSION HISTORY AND PHYSICAL NOTE  Jillian Obrien is a 33 y.o. female [phone] with IUP at [redacted]w[redacted]d by LMP presenting for SOL.  Arrived after about 8 hours of contractions at 8 cm. No prenatal care with this or last pregnancy.  She reports positive fetal movement. She denies leakage of fluid or vaginal bleeding.  Prenatal History/Complications: PNC: none Pregnancy complications:  - grand multiparity  - precipitous delivery  Past Medical History: Past Medical History:  Diagnosis Date  . Medical history non-contributory   . No pertinent past medical history     Past Surgical History: Past Surgical History:  Procedure Laterality Date  . NO PAST SURGERIES      Obstetrical History: OB History    Gravida  9   Para  8   Term  8   Preterm  0   AB  1   Living  8     SAB  1   TAB  0   Ectopic  0   Multiple  0   Live Births  8           Social History: Social History   Socioeconomic History  . Marital status: Married    Spouse name: Not on file  . Number of children: Not on file  . Years of education: Not on file  . Highest education level: Not on file  Occupational History  . Not on file  Social Needs  . Financial resource strain: Not hard at all  . Food insecurity:    Worry: Patient refused    Inability: Patient refused  . Transportation needs:    Medical: Patient refused    Non-medical: Patient refused  Tobacco Use  . Smoking status: Never Smoker  . Smokeless tobacco: Never Used  Substance and Sexual Activity  . Alcohol use: No  . Drug use: No  . Sexual activity: Yes    Birth control/protection: None  Lifestyle  . Physical activity:    Days per week: Patient refused    Minutes per session: Patient refused  . Stress: Not at all  Relationships  . Social connections:    Talks on phone: Patient refused    Gets together: Patient refused    Attends religious service: Patient refused    Active member of club or  organization: Patient refused    Attends meetings of clubs or organizations: Patient refused    Relationship status: Patient refused  Other Topics Concern  . Not on file  Social History Narrative  . Not on file    Family History: No family history on file.  Allergies: No Known Allergies  Medications Prior to Admission  Medication Sig Dispense Refill Last Dose  . Ferrous Fumarate (HEMOCYTE - 106 MG FE) 324 (106 Fe) MG TABS tablet Take 1 tablet (106 mg of iron total) by mouth 2 (two) times daily. 30 tablet 3   . ibuprofen (ADVIL,MOTRIN) 600 MG tablet Take 1 tablet (600 mg total) by mouth every 6 (six) hours as needed for moderate pain or cramping. 30 tablet 0   . phenylephrine-shark liver oil-mineral oil-petrolatum (PREPARATION H) 0.25-3-14-71.9 % rectal ointment Place rectally 4 (four) times daily as needed for hemorrhoids. 30 g 0   . polyethylene glycol powder (GLYCOLAX/MIRALAX) powder Take 17 g by mouth daily. 500 g 1   . Prenatal Vit-Fe Fumarate-FA (PRENATAL MULTIVITAMIN) TABS tablet Take 1 tablet by mouth daily at 12 noon.   07/23/2017 at Unknown time  . senna-docusate (SENOKOT-S) 8.6-50 MG  tablet Take 2 tablets by mouth at bedtime as needed for mild constipation. 30 tablet 0      Review of Systems  All systems reviewed and negative except as stated in HPI  Physical Exam Blood pressure 120/66, pulse 65, temperature 97.8 F (36.6 C), temperature source Oral, resp. rate 18, height 5\' 1"  (1.549 m), weight 74.9 kg, SpO2 93 %, unknown if currently breastfeeding. General appearance: alert, oriented, distress due to pain  Lungs: normal respiratory effort Heart: regular rate Abdomen: soft, non-tender; gravid, FH appropriate for GA Extremities: No calf swelling or tenderness Presentation: cephalic Fetal monitoring: baseline 120, mod var. + acels, variable decels Uterine activity: regular contractions q3-5762m Dilation: 10 Effacement (%): 100 Station: 0, -1 Exam by:: Dr.  Alysia PennaErvin  Prenatal labs: ABO, Rh: --/--/A POS (02/10 [id number]) Antibody: NEG (02/10 0650) Rubella:  unk RPR:   pending on admission HBsAg:   unk HIV:   unk GC/Chlamydia: unk GBS:   unk 2-hr GTT: none Genetic screening:  none Anatomy US: none  Prenatal Transfer Tool  Maternal Diabetes: No Genetic Screening: Declined Maternal Ultrasounds/Referrals: None Fetal Ultrasounds or other Referrals:  None Maternal Substance Abuse:  No Significant Maternal Medications:  None Significant Maternal Lab Results: None  Results for orders placed or performed during the hospital encounter of 12/27/18 (from the past 24 hour(s))  CBC   Collection Time: 12/27/18  6:50 AM  Result Value Ref Range   WBC 7.7 4.0 - 10.5 K/uL   RBC 3.62 (L) 3.87 - 5.11 MIL/uL   Hemoglobin 8.7 (L) 12.0 - 15.0 g/dL   HCT 54.029.0 (L) 98.136.0 - 19.146.0 %   MCV 80.1 80.0 - 100.0 fL   MCH 24.0 (L) 26.0 - 34.0 pg   MCHC 30.0 30.0 - 36.0 g/dL   RDW 47.815.0 29.511.5 - 62.115.5 %   Platelets 150 150 - 400 K/uL   nRBC 0.5 (H) 0.0 - 0.2 %  Type and screen [facility]    Collection Time: 12/27/18  6:50 AM  Result Value Ref Range   ABO/RH(D) A POS    Antibody Screen NEG    Sample Expiration      12/30/2018 Performed at [facility], [address] [place], [place] [address]     Patient Active Problem List   Diagnosis Date Noted  . Anemia 07/25/2017  . SVD (spontaneous vaginal delivery) 07/24/2017  . No prenatal care in current pregnancy 07/24/2017  . Indication for care in labor or delivery 04/10/2014  . GERD without esophagitis 03/23/2014  . Vitamin D deficiency 09/16/2013    Assessment: Jillian Obrien is a 33 y.o. [id number] at 6248w3d here for SOL  #Labor: active. Expectant management #Pain: Too advanced for medicated delivery #FWB: Cat 2 #ID:  GBS unknown #MOF: Breast #MOC:undecided #Circ:  Unknown  [name], MD  12/27/2018, 10:36 AM

## 2018-12-27 NOTE — MAU Note (Signed)
Pt presents to MAU with ctx @ 38 weeks since 0400.  X4V8. Denies VB & LOF.

## 2018-12-27 NOTE — Plan of Care (Signed)
  Problem: Education: Goal: Knowledge of Childbirth will improve Outcome: Progressing Goal: Ability to make informed decisions regarding treatment and plan of care will improve Outcome: Progressing Goal: Ability to state and carry out methods to decrease the pain will improve Outcome: Progressing   Problem: Coping: Goal: Ability to verbalize concerns and feelings about labor and delivery will improve Outcome: Progressing   Problem: Life Cycle: Goal: Ability to make normal progression through stages of labor will improve Outcome: Progressing Goal: Ability to effectively push during vaginal delivery will improve Outcome: Progressing   Problem: Role Relationship: Goal: Ability to demonstrate positive interaction with the child will improve Outcome: Progressing   Problem: Safety: Goal: Risk of complications during labor and delivery will decrease Outcome: Progressing   Problem: Pain Management: Goal: Relief or control of pain from uterine contractions will improve Outcome: Progressing   Problem: Health Behavior/Discharge Planning: Goal: Ability to manage health-related needs will improve Outcome: Progressing   Problem: Clinical Measurements: Goal: Ability to maintain clinical measurements within normal limits will improve Outcome: Progressing Goal: Will remain free from infection Outcome: Progressing Goal: Diagnostic test results will improve Outcome: Progressing Goal: Respiratory complications will improve Outcome: Progressing Goal: Cardiovascular complication will be avoided Outcome: Progressing   Problem: Activity: Goal: Risk for activity intolerance will decrease Outcome: Progressing   Problem: Nutrition: Goal: Adequate nutrition will be maintained Outcome: Progressing   Problem: Elimination: Goal: Will not experience complications related to bowel motility Outcome: Progressing Goal: Will not experience complications related to urinary retention Outcome:  Progressing   Problem: Pain Managment: Goal: General experience of comfort will improve Outcome: Progressing   Problem: Skin Integrity: Goal: Risk for impaired skin integrity will decrease Outcome: Progressing   

## 2018-12-27 NOTE — Lactation Note (Addendum)
This note was copied from a baby's chart. Lactation Consultation Note  Patient Name: Jillian Obrien [id number] Date: 12/27/2018   [name] Jillian Obrien now 80 hours old. SVD vacuum assist. Mom reports  previous infants have been jaundiced.    Mom is an experienced breastfeeder.  Last baby is 18 months and just self weaned 4 months ago. Mom reports she has never had any challenges with breastfeeding. He is breastfeeding on arrival.He is kind of bobbing his head off and on a little. Urged hand expression before and past breastfeeding.Urged mom to add hand expression and feeding back all expressed mothers milk past breastfeeding.  As much as she can get out and get him to take. Demo spoon feeding and hand expression  Mom denies discomfort.  Reviewed and left handouts.  [facility] breastfeeding resource list, [facility] consultation services and voids/stools log.  Mom reports she feels she is good and does not need to be reseen while here by lactation.  Mom reports she will call if needs anything.  Praised breastfeeding.  Maternal Data    Feeding Feeding Type: Breast Fed  LATCH Score Latch: Repeated attempts needed to sustain latch, nipple held in mouth throughout feeding, stimulation needed to elicit sucking reflex.  Audible Swallowing: None(not actively sucking, only 2-3 sucks at a time)  Type of Nipple: Everted at rest and after stimulation  Comfort (Breast/Nipple): Soft / non-tender  Hold (Positioning): No assistance needed to correctly position infant at breast.  [facility] Score: 7  Interventions    Lactation Tools Discussed/Used     Consult Status      Neomia Dear 12/27/2018, 8:33 PM

## 2018-12-27 NOTE — Discharge Summary (Addendum)
Postpartum Discharge Summary     Patient Name: Jillian Obrien DOB: 07-04-1986 MRN: [id number]  Date of admission: 12/27/2018 Delivering Provider: [name]   Date of discharge: 12/29/2018  Admitting diagnosis: 38WKS CTX Intrauterine pregnancy: [redacted]w[redacted]d     Secondary diagnosis:  Principal Problem:   Precipitous delivery Active Problems:   Indication for care in labor or delivery   No prenatal care in current pregnancy   Anemia   Occiput posterior presentation of fetus   Vacuum-assisted vaginal delivery  Additional problems: None     Discharge diagnosis: Term Pregnancy Delivered                                                                                                Post partum procedures:None  Augmentation: AROM  Complications: Vacuum assisted delivery  Hospital course:  Onset of Labor With Vaginal Delivery     33 y.o. yo [id number] at [redacted]w[redacted]d was admitted in Active Labor on 12/27/2018. Patient had an uncomplicated labor course as follows:  Membrane Rupture Time/Date: 7:06 AM ,12/27/2018   Intrapartum Procedures: Episiotomy: None [1]                                         Lacerations:  None [1]  Patient had a delivery of a Viable infant. 12/27/2018  Information for the patient's newborn:  Adoria, Klauss [[id number]]  Delivery Method: Vag-Spont    Pateint had an uncomplicated postpartum course.  She is ambulating, tolerating a regular diet, passing flatus, and urinating well. Patient is discharged home in stable condition on 12/29/18.   Magnesium Sulfate recieved: No BMZ received: No  Physical exam  Vitals:   12/28/18 2252 12/29/18 0526 12/29/18 0547 12/29/18 1009  BP: 140/71 (!) 156/89 (!) 149/81 131/73  Pulse: 79 66 64 86  Resp: 16 17    Temp: 98.1 F (36.7 C) 97.9 F (36.6 C)    TempSrc: Oral Oral    SpO2:      Weight:      Height:       General: alert, cooperative and no distress Lochia: appropriate Uterine Fundus: firm Incision: N/A DVT  Evaluation: No evidence of DVT seen on physical exam. Labs: Lab Results  Component Value Date   WBC 7.7 12/27/2018   HGB 8.7 (L) 12/27/2018   HCT 29.0 (L) 12/27/2018   MCV 80.1 12/27/2018   PLT 150 12/27/2018   CMP Latest Ref Rng & Units 12/29/2018  Glucose 70 - 99 mg/dL 70  BUN 6 - 20 mg/dL 13  Creatinine 0.35 - 4.65 mg/dL 6.81  Sodium 275 - 170 mmol/L 135  Potassium 3.5 - 5.1 mmol/L 4.1  Chloride 98 - 111 mmol/L 106  CO2 22 - 32 mmol/L 23  Calcium 8.9 - 10.3 mg/dL 8.3(L)  Total Protein 6.5 - 8.1 g/dL 5.1(L)  Total Bilirubin 0.3 - 1.2 mg/dL 0.5  Alkaline Phos 38 - 126 U/L 91  AST 15 - 41 U/L 22  ALT 0 - 44 U/L 10  Discharge instruction: per After Visit Summary and "Baby and Me Booklet".  After visit meds:  Allergies as of 12/29/2018   No Known Allergies     Medication List    TAKE these medications   ibuprofen 600 MG tablet Commonly known as:  ADVIL,MOTRIN Take 1 tablet (600 mg total) by mouth every 6 (six) hours.   oxyCODONE-acetaminophen 5-325 MG tablet Commonly known as:  PERCOCET/ROXICET Take 1 tablet by mouth every 8 (eight) hours as needed for up to 3 days for moderate pain.   prenatal multivitamin Tabs tablet Take 1 tablet by mouth daily at 12 noon.   senna-docusate 8.6-50 MG tablet Commonly known as:  Senokot-S Take 1 tablet by mouth daily. Start taking on:  December 30, 2018       Diet: routine diet  Activity: Advance as tolerated. Pelvic rest for 6 weeks.   Outpatient follow up:1 week Follow up Appt: Future Appointments  Date Time Provider Department Center  01/10/2019 10:30 AM Constant, Gigi GinPeggy, MD [facility] None   Follow up Visit: Follow-up Information    [facility]   Why:  Be sure to keep your appointment on Feb 24th- it is a pre operative appointment to get your tubal scheduled. Contact information: [address] [place] [phone]         Please schedule this  patient for Postpartum visit in: 1 week with the following provider: Any provider For C/S patients schedule nurse incision check in weeks 2 weeks: no Low risk pregnancy complicated by: Grand multiparity, no prenatal care Delivery mode:  Vacuum Anticipated Birth Control:  other/unsure PP Procedures needed: None  Schedule Integrated BH visit: no  Newborn Data: Live born female  Birth Weight: 7 lb 1.4 oz (3215 g) APGAR: 8, 9  Newborn Delivery   Birth date/time:  12/27/2018 08:26:00 Delivery type:  Vaginal, Vacuum (Extractor)     Baby Feeding: Breast Disposition:home with mother   12/29/2018 Dollene ClevelandHannah C Anderson, DO   I spoke with and examined patient and agree with resident/PA-S/MS/SNM's note and plan of care.  BP's mildly elevated since last night. Denies ha, visual changes, ruq/epigastric pain, n/v.  Rx Norvasc 10mg  daily. BP check on 2/14. Reviewed pre-e s/s, reasons to seek care.  Cheral MarkerKimberly R. , CNM, [facility] 12/30/2018 9:17 AM

## 2018-12-28 LAB — HEPATITIS B SURFACE ANTIBODY, QUANTITATIVE: Hep B S AB Quant (Post): 21.2 m[IU]/mL (ref >9.9)

## 2018-12-28 LAB — RUBELLA SCREEN: Rubella: 1.11 index (ref >0.99)

## 2018-12-28 MED ORDER — HYDROCORTISONE ACE-PRAMOXINE 1-1 % RE CREA
TOPICAL_CREAM | Freq: Two times a day (BID) | RECTAL | Status: DC
  Filled 2018-12-28: qty 30

## 2018-12-28 MED ORDER — HYDROCORTISONE ACE-PRAMOXINE 1-1 % RE FOAM
1.0000 | Freq: Two times a day (BID) | RECTAL | Status: DC
  Administered 2018-12-28 – 2018-12-29 (×3): 1 via RECTAL
  Filled 2018-12-28 (×3): qty 10

## 2018-12-28 MED ORDER — OXYCODONE-ACETAMINOPHEN 5-325 MG PO TABS
1.0000 | ORAL_TABLET | ORAL | Status: DC | PRN
  Administered 2018-12-28 – 2018-12-29 (×4): 1 via ORAL
  Filled 2018-12-28 (×4): qty 1

## 2018-12-28 NOTE — Clinical Social Work Maternal (Signed)
CLINICAL SOCIAL WORK MATERNAL/CHILD NOTE  Patient Details  Name: Jillian Obrien MRN: [id number] Date of Birth: 02-08-1986  Date:  12/28/2018  Clinical Social Worker Initiating Note:  Laurey Arrow Date/Time: Initiated:  12/28/18/1153     Child's Name:  Jillian Obrien   Biological Parents:  Mother, Father   Need for Interpreter:  None   Reason for Referral:      Address:  [place] [address]    Phone number:  [phone] (home)     Additional phone number:   Household Members/Support Persons (HM/SP):   Household Member/Support Person 1, Household Member/Support Person 2, Household Member/Support Person 3([name] has 7 older children that resides with [name] and FOB. )   HM/SP Name Relationship DOB or Age  HM/SP -1 Jaquelyn [place] FOB 09/30/1981  HM/SP -2 Seraphina Dam daughter 04/10/16  HM/SP -3 Zoe Dam daughter 07/13/17  HM/SP -4        HM/SP -5        HM/SP -6        HM/SP -7        HM/SP -8          Natural Supports (not living in the home):  Extended Family([name]'s sister in law will provide support. )   Professional Supports: None   Employment: Agricultural engineer   Type of Work:     Education:  Programmer, systems   Homebound arranged:    Museum/gallery curator Resources:  [employer]   Other Resources:  [employer], Physicist, medical    Cultural/Religious Considerations Which May Impact Care:  None Reported  Strengths:  Ability to meet basic needs , Pediatrician chosen   Psychotropic Medications:         Pediatrician:    [place]  Pediatrician List:   [facility]      Pediatrician Fax Number:    Risk Factors/Current Problems:  None   Cognitive State:  Able to Concentrate , Alert , Insightful , Linear Thinking    Mood/Affect:  Flat , Interested , Relaxed , Calm    CSW Assessment: CSW met with [name] in room 145 to complete an  assessment for [facility].  When CSW arrived, [name] was bonding with infant as evidence by engaging in skin to skin.  FOB was also present and was watching TV.  With [name]'s permission, CSW asked FOB to leave the room in order to meet with [name] in private.  FOB left without incident.  [name] was soft-spoken, appeared flat, but was easy to engage.    CSW asked about [name]'s lack of PNC and [name] reported, "I didn't have time.  I have other children at home and I just didn't have time to go to the doctors. CSW assessed for barriers for follow-up care for [name] and infant and [name] denied all barriers.  [name] was able to identify a pediatrician office and reported that her older children attending pediatric appointments regularly. CSW informed [name] of hospital's policy regarding NPNC and [name] was understanding. CSW made [name] aware of drug screening for infant and [name] did not appear bothered.  [name] denied the use of all illicit substance and denied having any CPS hx.  CSW shared that infant's UDS was negative and that CSW will continue to monitor infant's CDS.  [name] was understanding that CSW will make a report to [place] if warranted.  [name] reported having all essential items for infant and feeling prepared to parent.    CSW Plan/Description:  No Further Intervention Required/No Barriers to Discharge, Perinatal Mood and Anxiety Disorder (PMADs) Education, [place], CSW Will Continue to Monitor Umbilical Cord Tissue Drug Screen Results and Make Report if Warranted   Laurey Arrow, MSW, LCSW Clinical Social Work [phone]   Dimple Nanas, LCSW 12/28/2018, 11:56 AM

## 2018-12-28 NOTE — Lactation Note (Signed)
This note was copied from a baby's chart. Lactation Consultation Note  Patient Name: Boy Angeliki Bugh [id number] Date: 12/28/2018 Reason for consult: Follow-up assessment;Infant weight loss;Early term 36-38.6wks  Baby is 94 hours old  LC reviewed and update the doc flow sheets  Baby awake and rooting even though per mom just fed for 15 mins / also had a wet diaper.  @ the consult worked with mom to obtain better support and depth due to mom saying she felt  The baby wasn't latching deep enough.  Latched on the left breast / football/ multiple swallows and fed for 12 mins and released on his own,  Nipple well rounded.  LC encouraged mom to use consistent and adequate support and not to allow the baby to nibble onto the  Breast.  Discussed nutritive vs non - nutritive feeding patterns and the importance of watching the baby for hanging  Out latched.  LC reviewed sore nipple and engorgement prevention and tx .  Mom had mentioned she is alittle sore / no breakdown noted either nipple.  LC instructed on the use of hand pump for D/C and due to the size of her nipple - provided a #24 and #27 F  When the milk comes in .  Mother informed of post-discharge support and given phone number to the lactation department, including services for phone call assistance; out-patient appointments; and breastfeeding support group. List of other breastfeeding resources in the community given in the handout. Encouraged mother to call for problems or concerns related to breastfeeding.     Maternal Data Has patient been taught Hand Expression?: Yes Does the patient have breastfeeding experience prior to this delivery?: Yes  Feeding Feeding Type: Breast Fed  LATCH Score Latch: Grasps breast easily, tongue down, lips flanged, rhythmical sucking.  Audible Swallowing: Spontaneous and intermittent  Type of Nipple: Everted at rest and after stimulation  Comfort (Breast/Nipple): Soft / non-tender  Hold  (Positioning): Assistance needed to correctly position infant at breast and maintain latch.  LATCH Score: 9  Interventions Interventions: Breast feeding basics reviewed;Assisted with latch;Skin to skin;Breast massage;Breast compression;Adjust position;Support pillows;Position options;Hand pump  Lactation Tools Discussed/Used Tools: Pump;Flanges Flange Size: 27;24 Breast pump type: Manual WIC Program: Yes Pump Review: Milk Storage;Setup, frequency, and cleaning Initiated by:: MAI  Date initiated:: 12/28/18   Consult Status Consult Status: PRN    Matilde Sprang Annarose Ouellet 12/28/2018, 5:30 PM

## 2018-12-28 NOTE — Progress Notes (Signed)
Post Partum Day 1 Subjective: voiding, tolerating PO, + flatus and Patient was pleasue and cooperative. She denies experiencing HA, CHX pain, SOB, fatiue, or change in vision. Patient admits to painful cramping and some bleeding. Patient feels her pain meds do not help enough.  Patient states she is bleeding less than yesterday, feels as much as a normal period.  Objective: Blood pressure 118/61, pulse 61, temperature 98 F (36.7 C), temperature source Oral, resp. rate 18, height 5\' 1"  (1.549 m), weight 74.9 kg, SpO2 (!) 78 %, unknown if currently breastfeeding.  Physical Exam:  General: alert, cooperative, appears stated age and no distress Lochia: appropriate Uterine Fundus: firm Incision: N/A DVT Evaluation: No evidence of DVT seen on physical exam.  Recent Labs    12/27/18 0650  HGB 8.7*  HCT 29.0*    Assessment/Plan: Contraception patient expressed intrested in BTL, no issues feeding (bottle and breast)    LOS: 1 day   Carroll Sage 12/28/2018, 8:41 AM

## 2018-12-29 ENCOUNTER — Encounter (HOSPITAL_COMMUNITY): Payer: Self-pay

## 2018-12-29 LAB — COMPREHENSIVE METABOLIC PANEL
ALT: 10 U/L (ref 0–44)
AST: 22 U/L (ref 15–41)
Albumin: 2.2 g/dL — ABNORMAL LOW (ref 3.5–5.0)
Alkaline Phosphatase: 91 U/L (ref 38–126)
Anion gap: 6 (ref 5–15)
BUN: 13 mg/dL (ref 6–20)
CO2: 23 mmol/L (ref 22–32)
Calcium: 8.3 mg/dL — ABNORMAL LOW (ref 8.9–10.3)
Chloride: 106 mmol/L (ref 98–111)
Creatinine, Ser: 0.63 mg/dL (ref 0.44–1.00)
GFR calc Af Amer: >60 mL/min (ref >60)
GFR calc non Af Amer: >60 mL/min (ref >60)
Glucose, Bld: 70 mg/dL (ref 70–99)
Potassium: 4.1 mmol/L (ref 3.5–5.1)
Sodium: 135 mmol/L (ref 135–145)
Total Bilirubin: 0.5 mg/dL (ref 0.3–1.2)
Total Protein: 5.1 g/dL — ABNORMAL LOW (ref 6.5–8.1)

## 2018-12-29 LAB — RAPID URINE DRUG SCREEN, HOSP PERFORMED
Amphetamines: NOT DETECTED
Barbiturates: NOT DETECTED
Benzodiazepines: NOT DETECTED
Cocaine: NOT DETECTED
Opiates: NOT DETECTED
TETRAHYDROCANNABINOL: NOT DETECTED

## 2018-12-29 LAB — PROTEIN / CREATININE RATIO, URINE
Creatinine, Urine: 59 mg/dL
Total Protein, Urine: <6 mg/dL

## 2018-12-29 MED ORDER — IBUPROFEN 600 MG PO TABS: 600.0000 mg | ORAL_TABLET | Freq: Four times a day (QID) | ORAL | 0 refills | Status: DC

## 2018-12-29 MED ORDER — SENNOSIDES-DOCUSATE SODIUM 8.6-50 MG PO TABS: 1.0000 | ORAL_TABLET | ORAL | 0 refills | Status: DC

## 2018-12-29 MED ORDER — OXYCODONE-ACETAMINOPHEN 5-325 MG PO TABS: 1.0000 | ORAL_TABLET | Freq: Three times a day (TID) | ORAL | 0 refills | Status: AC | PRN

## 2018-12-29 MED ORDER — AMLODIPINE BESYLATE 10 MG PO TABS
10.0000 mg | ORAL_TABLET | Freq: Every day | ORAL | Status: DC
  Administered 2018-12-29: 10 mg via ORAL
  Filled 2018-12-29 (×2): qty 1

## 2018-12-30 ENCOUNTER — Other Ambulatory Visit: Payer: Self-pay | Admitting: Obstetrics and Gynecology

## 2018-12-30 MED ORDER — ENALAPRIL MALEATE 5 MG PO TABS: 5.0000 mg | ORAL_TABLET | Freq: Every day | ORAL | 1 refills | Status: DC

## 2019-01-07 ENCOUNTER — Ambulatory Visit: Payer: Medicaid Other

## 2019-01-07 VITALS — BP 124/82 | HR 110 | Wt 141.0 lb

## 2019-01-07 DIAGNOSIS — Z0130 Encounter for examination of blood pressure without abnormal findings: Secondary | ICD-10-CM

## 2019-01-07 NOTE — Progress Notes (Signed)
Pt is here for blood pressure check. She delivered on 12/27/18. No PNC, prescribed Vasotec 5mg  daily on 12/30/18. Pts BP is WNL today and reviewed with Dr. Clearance Coots who advised pt stay on prescribed medication until post partum appointment. Pt verbalized understanding.

## 2019-01-10 ENCOUNTER — Ambulatory Visit: Payer: Medicaid Other | Admitting: Obstetrics and Gynecology

## 2019-01-11 ENCOUNTER — Encounter: Payer: Self-pay | Admitting: Obstetrics and Gynecology

## 2019-01-11 ENCOUNTER — Telehealth: Payer: Self-pay | Admitting: Obstetrics and Gynecology

## 2019-01-11 NOTE — Telephone Encounter (Signed)
Attempted to CB both phone numbers on file, unable to LVM.  Mailed a letter to call office and rescheduled missed appointment.

## 2019-01-26 LAB — HEPATITIS B SURFACE ANTIGEN

## 2019-02-08 ENCOUNTER — Ambulatory Visit: Payer: Medicaid Other | Admitting: Family Medicine

## 2021-08-25 DIAGNOSIS — Z20822 Contact with and (suspected) exposure to covid-19: Secondary | ICD-10-CM | POA: Diagnosis not present

## 2023-08-31 ENCOUNTER — Encounter: Payer: Self-pay | Admitting: Obstetrics & Gynecology

## 2023-09-01 ENCOUNTER — Other Ambulatory Visit: Payer: Self-pay | Admitting: *Deleted

## 2023-09-01 DIAGNOSIS — O219 Vomiting of pregnancy, unspecified: Secondary | ICD-10-CM

## 2023-09-01 MED ORDER — PROMETHAZINE HCL 25 MG PO TABS: 25.0000 mg | ORAL_TABLET | Freq: Four times a day (QID) | ORAL | 1 refills | Status: DC | PRN

## 2023-09-01 NOTE — Progress Notes (Signed)
RX phenergan for nausea in pregnancy per protocol.

## 2023-09-08 ENCOUNTER — Encounter: Payer: Self-pay | Admitting: *Deleted

## 2023-09-08 DIAGNOSIS — Z3490 Encounter for supervision of normal pregnancy, unspecified, unspecified trimester: Secondary | ICD-10-CM | POA: Insufficient documentation

## 2023-09-14 ENCOUNTER — Encounter: Payer: Self-pay | Admitting: Obstetrics & Gynecology

## 2023-09-14 ENCOUNTER — Other Ambulatory Visit (HOSPITAL_COMMUNITY)
Admission: RE | Admit: 2023-09-14 | Discharge: 2023-09-14 | Disposition: A | Discharge status: Home / Self Care | Payer: Medicaid Other | Source: Ambulatory Visit | Attending: Obstetrics & Gynecology | Admitting: Obstetrics & Gynecology

## 2023-09-14 ENCOUNTER — Other Ambulatory Visit (INDEPENDENT_AMBULATORY_CARE_PROVIDER_SITE_OTHER): Payer: Self-pay

## 2023-09-14 ENCOUNTER — Ambulatory Visit (INDEPENDENT_AMBULATORY_CARE_PROVIDER_SITE_OTHER): Payer: Medicaid Other | Admitting: Obstetrics & Gynecology

## 2023-09-14 VITALS — BP 137/86 | HR 88 | Wt 151.0 lb

## 2023-09-14 DIAGNOSIS — Z3A09 9 weeks gestation of pregnancy: Secondary | ICD-10-CM

## 2023-09-14 DIAGNOSIS — Z3490 Encounter for supervision of normal pregnancy, unspecified, unspecified trimester: Secondary | ICD-10-CM

## 2023-09-14 DIAGNOSIS — O0941 Supervision of pregnancy with grand multiparity, first trimester: Secondary | ICD-10-CM

## 2023-09-14 DIAGNOSIS — O09511 Supervision of elderly primigravida, first trimester: Secondary | ICD-10-CM

## 2023-09-14 DIAGNOSIS — O09529 Supervision of elderly multigravida, unspecified trimester: Secondary | ICD-10-CM | POA: Diagnosis not present

## 2023-09-14 DIAGNOSIS — Z3480 Encounter for supervision of other normal pregnancy, unspecified trimester: Secondary | ICD-10-CM

## 2023-09-14 DIAGNOSIS — E559 Vitamin D deficiency, unspecified: Secondary | ICD-10-CM

## 2023-09-14 DIAGNOSIS — Z1339 Encounter for screening examination for other mental health and behavioral disorders: Secondary | ICD-10-CM

## 2023-09-14 DIAGNOSIS — Z3A10 10 weeks gestation of pregnancy: Secondary | ICD-10-CM | POA: Diagnosis not present

## 2023-09-14 DIAGNOSIS — Z3482 Encounter for supervision of other normal pregnancy, second trimester: Secondary | ICD-10-CM

## 2023-09-14 MED ORDER — ONDANSETRON HCL 4 MG PO TABS: 4.0000 mg | ORAL_TABLET | Freq: Three times a day (TID) | ORAL | 0 refills | Status: DC | PRN

## 2023-09-14 NOTE — Progress Notes (Signed)
Daughter born with liver disease and has had a liver transplant Last pap 2016

## 2023-09-14 NOTE — Progress Notes (Signed)
Subjective:    Jillian Obrien is a [id number] [redacted]w[redacted]d being seen today for her first obstetrical visit.  Her obstetrical history is significant for advanced maternal age. Patient does intend to breast feed. Pregnancy history fully reviewed.  Patient reports nausea.  Vitals:   09/14/23 1053  BP: (!) 143/83  Pulse: 88  Weight: 151 lb (68.5 kg)    HISTORY: OB History  Gravida Para Term Preterm AB Living  10 8 8  0 1 8  SAB IAB Ectopic Multiple Live Births  1 0 0 0 8    # Outcome Date GA Lbr Len/2nd Weight Sex Type Anes PTL Lv  10 Current           9 Term 12/27/18 [redacted]w[redacted]d 08:06 / 01:20 7 lb 1.4 oz (3.215 kg) M Vag-Vacuum None  LIV  8 Term 07/24/17 [redacted]w[redacted]d 12:16 / 00:13 6 lb 15.8 oz (3.17 kg) F Vag-Spont EPI  LIV     Birth Comments: none  7 Term 04/10/16 [redacted]w[redacted]d / 00:55 7 lb 6.5 oz (3.36 kg) F Vag-Spont EPI  LIV  6 Term 04/10/14 [redacted]w[redacted]d 04:24 / 04:39 7 lb 11 oz (3.487 kg) M Vag-Spont EPI  LIV  5 Term 10/18/11 [redacted]w[redacted]d 07:54 / 00:15 6 lb 14.1 oz (3.12 kg) F Vag-Spont EPI  LIV     Birth Comments: WNL  4 Term 05/22/10 [redacted]w[redacted]d 03:00 6 lb 14 oz (3.118 kg) F Vag-Spont None  LIV     Birth Comments: born with liver disease- liver tranplant and Brickets cancer - tansplant at 7 months and cancer diagnosed at age 42  3 Term 10/14/06 [redacted]w[redacted]d 02:00 6 lb 10 oz (3.005 kg) F Vag-Spont None  LIV  2 Term 04/04/02 [redacted]w[redacted]d 10:00 6 lb 7 oz (2.92 kg) M Vag-Spont EPI  LIV  1 SAB            Past Medical History:  Diagnosis Date   Medical history non-contributory    No pertinent past medical history    Past Surgical History:  Procedure Laterality Date   NO PAST SURGERIES     Family History  Problem Relation Age of Onset   Diabetes Mother    Hypertension Father      Exam    Uterus:     Pelvic Exam:    Perineum: No Hemorrhoids   Vulva: normal   Vagina:  normal mucosa   pH: N/a   Cervix: no lesions and scant spotting after pap smear   Adnexa: normal adnexa   Bony Pelvis: average  System: Breast:  normal  appearance, no masses or tenderness   Skin: normal coloration and turgor, no rashes    Neurologic: oriented, normal mood   Extremities: normal strength, tone, and muscle mass   HEENT sclera clear, anicteric, oropharynx clear, no lesions, neck supple with midline trachea, thyroid without masses, and trachea midline   Mouth/Teeth mucous membranes moist, pharynx normal without lesions and dental hygiene good   Neck supple and no masses   Cardiovascular: regular rate and rhythm   Respiratory:  appears well, vitals normal, no respiratory distress, acyanotic, normal RR, neck free of mass or lymphadenopathy, chest clear, no wheezing, crepitations, rhonchi, normal symmetric air entry   Abdomen: soft, non-tender; bowel sounds normal; no masses,  no organomegaly   Urinary: urethral meatus normal      Assessment:    Pregnancy: [id number] Patient Active Problem List   Diagnosis Date Noted   Supervision of normal pregnancy 09/08/2023   Precipitous delivery 12/27/2018  Anemia 07/25/2017   GERD without esophagitis 03/23/2014   Vitamin D deficiency 09/16/2013        Plan:  Initial labs drawn. Prenatal vitamins. Problem list reviewed and updated. Genetic Screening discussed:  Panorama and AFP Ultrasound ordered for 18-20 weeks BP cuff given and she will report through Baby Rx Low dose aspirin at 13 weeks Needs Hgb A1c, fasting CMP for glucose and TSH (AMA) Vit D level (Hx of Vitamin D deficiency) Pap smear Nausea--phenergan at night and zofram during hte day.  Colace and miralax for constipation        Jillian Obrien 09/14/2023

## 2023-09-16 LAB — PREGNANCY, INITIAL SCREEN
Antibody Screen: NEGATIVE
Basophils Absolute: 0 10*3/uL (ref 0.0–0.2)
Basos: 0 %
Bilirubin, UA: NEGATIVE
Chlamydia trachomatis, NAA: NEGATIVE
EOS (ABSOLUTE): 0 10*3/uL (ref 0.0–0.4)
Eos: 1 %
Glucose, UA: NEGATIVE
HCV Ab: NONREACTIVE
HIV Screen 4th Generation wRfx: NONREACTIVE
Hematocrit: 41.6 % (ref 34.0–46.6)
Hemoglobin: 13.7 g/dL (ref 11.1–15.9)
Hepatitis B Surface Ag: NEGATIVE
Immature Grans (Abs): 0 10*3/uL (ref 0.0–0.1)
Immature Granulocytes: 0 %
Ketones, UA: NEGATIVE
Leukocytes,UA: NEGATIVE
Lymphocytes Absolute: 1.3 10*3/uL (ref 0.7–3.1)
Lymphs: 16 %
MCH: 29.1 pg (ref 26.6–33.0)
MCHC: 32.9 g/dL (ref 31.5–35.7)
MCV: 89 fL (ref 79–97)
Monocytes Absolute: 0.5 10*3/uL (ref 0.1–0.9)
Monocytes: 6 %
Neisseria Gonorrhoeae by PCR: NEGATIVE
Neutrophils Absolute: 6.4 10*3/uL (ref 1.4–7.0)
Neutrophils: 77 %
Nitrite, UA: NEGATIVE
Platelets: 292 10*3/uL (ref 150–450)
Protein,UA: NEGATIVE
RBC, UA: NEGATIVE
RBC: 4.7 x10E6/uL (ref 3.77–5.28)
RDW: 12.5 % (ref 11.7–15.4)
RPR Ser Ql: NONREACTIVE
Rh Factor: POSITIVE
Rubella Antibodies, IGG: 1.39 {index} (ref >0.99)
Specific Gravity, UA: 1.011 (ref 1.005–1.030)
Urobilinogen, Ur: 0.2 mg/dL (ref 0.2–1.0)
WBC: 8.3 10*3/uL (ref 3.4–10.8)
pH, UA: 6.5 (ref 5.0–7.5)

## 2023-09-16 LAB — CYTOLOGY - PAP
Comment: NEGATIVE
Diagnosis: NEGATIVE
High risk HPV: NEGATIVE

## 2023-09-16 LAB — URINE CULTURE, OB REFLEX: Organism ID, Bacteria: NO GROWTH

## 2023-09-16 LAB — MICROSCOPIC EXAMINATION
Bacteria, UA: NONE SEEN
Casts: NONE SEEN /[LPF]
WBC, UA: NONE SEEN /[HPF] (ref 0–5)

## 2023-09-16 LAB — HCV INTERPRETATION

## 2023-09-17 ENCOUNTER — Encounter: Payer: Self-pay | Admitting: Obstetrics & Gynecology

## 2023-09-21 ENCOUNTER — Other Ambulatory Visit: Payer: Self-pay | Admitting: *Deleted

## 2023-09-21 DIAGNOSIS — O09521 Supervision of elderly multigravida, first trimester: Secondary | ICD-10-CM

## 2023-09-21 DIAGNOSIS — Z3482 Encounter for supervision of other normal pregnancy, second trimester: Secondary | ICD-10-CM | POA: Diagnosis not present

## 2023-09-21 DIAGNOSIS — O09511 Supervision of elderly primigravida, first trimester: Secondary | ICD-10-CM | POA: Diagnosis not present

## 2023-09-21 DIAGNOSIS — Z3490 Encounter for supervision of normal pregnancy, unspecified, unspecified trimester: Secondary | ICD-10-CM

## 2023-09-23 DIAGNOSIS — O09521 Supervision of elderly multigravida, first trimester: Secondary | ICD-10-CM | POA: Diagnosis not present

## 2023-09-24 ENCOUNTER — Encounter: Payer: Self-pay | Admitting: Obstetrics & Gynecology

## 2023-09-24 LAB — HEMOGLOBIN A1C
Est. average glucose Bld gHb Est-mCnc: 105 mg/dL
Hgb A1c MFr Bld: 5.3 % (ref 4.8–5.6)

## 2023-09-24 LAB — COMPREHENSIVE METABOLIC PANEL
ALT: 10 [IU]/L (ref 0–32)
AST: 14 [IU]/L (ref 0–40)
Albumin: 3.9 g/dL (ref 3.9–4.9)
Alkaline Phosphatase: 61 [IU]/L (ref 44–121)
BUN/Creatinine Ratio: 13 (ref 9–23)
BUN: 7 mg/dL (ref 6–20)
Bilirubin Total: 0.3 mg/dL (ref 0.0–1.2)
CO2: 19 mmol/L — ABNORMAL LOW (ref 20–29)
Calcium: 9.2 mg/dL (ref 8.7–10.2)
Chloride: 103 mmol/L (ref 96–106)
Creatinine, Ser: 0.56 mg/dL — ABNORMAL LOW (ref 0.57–1.00)
Globulin, Total: 2.7 g/dL (ref 1.5–4.5)
Glucose: 91 mg/dL (ref 70–99)
Potassium: 4 mmol/L (ref 3.5–5.2)
Sodium: 137 mmol/L (ref 134–144)
Total Protein: 6.6 g/dL (ref 6.0–8.5)
eGFR: 120 mL/min/{1.73_m2} (ref >59)

## 2023-09-24 LAB — PROTEIN / CREATININE RATIO, URINE
Creatinine, Urine: 155 mg/dL
Protein, Ur: 20.7 mg/dL
Protein/Creat Ratio: 134 mg/g{creat} (ref 0–200)

## 2023-09-27 LAB — PANORAMA PRENATAL TEST FULL PANEL:PANORAMA TEST PLUS 5 ADDITIONAL MICRODELETIONS: FETAL FRACTION: 7

## 2023-09-30 LAB — HORIZON 4 (SMA, CF, FRAGILE X, DMD)
CYSTIC FIBROSIS: NEGATIVE
DUCHENNE/BECKER MUSCULAR DYSTROPHY: NEGATIVE
FRAGILE X SYNDROME: NEGATIVE
REPORT SUMMARY: NEGATIVE
SPINAL MUSCULAR ATROPHY: NEGATIVE

## 2023-11-09 DIAGNOSIS — O09522 Supervision of elderly multigravida, second trimester: Secondary | ICD-10-CM | POA: Insufficient documentation

## 2023-11-11 ENCOUNTER — Encounter: Payer: Self-pay | Admitting: Obstetrics & Gynecology

## 2023-11-13 NOTE — Progress Notes (Unsigned)
   PRENATAL VISIT NOTE  Subjective:  Jillian Obrien is a 37 y.o. [id number] at [redacted]w[redacted]d being seen today for ongoing prenatal care.  She is currently monitored for the following issues for this low-risk pregnancy and has Vitamin D deficiency; GERD without esophagitis; Precipitous delivery; Supervision of high risk pregnancy; and AMA (advanced maternal age) multigravida 35+, second trimester on their problem list.  Patient reports no complaints.  Contractions: Not present. Vag. Bleeding: None.  Movement: Present. Denies leaking of fluid.   The following portions of the patient's history were reviewed and updated as appropriate: allergies, current medications, past family history, past medical history, past social history, past surgical history and problem list.   Objective:   Vitals:   11/16/23 1038  BP: 137/83  Pulse: 98  Weight: 154 lb (69.9 kg)    Fetal Status: Fetal Heart Rate (bpm): 151   Movement: Present     General:  Alert, oriented and cooperative. Patient is in no acute distress.  Skin: Skin is warm and dry. No rash noted.   Cardiovascular: Normal heart rate noted  Respiratory: Normal respiratory effort, no problems with respiration noted  Abdomen: Soft, gravid, appropriate for gestational age.  Pain/Pressure: Absent     Pelvic: Cervical exam deferred        Extremities: Normal range of motion.  Edema: None  Mental Status: Normal mood and affect. Normal behavior. Normal judgment and thought content.   Assessment and Plan:  Pregnancy: [id number] at [redacted]w[redacted]d 1. Encounter for supervision of normal pregnancy, antepartum, unspecified gravidity (Primary) Anatomy US today 2 hr next visit (babyscripts)  2. Vitamin D deficiency Taking PNV.   3. Precipitous delivery  4. AMA (advanced maternal age) multigravida 35+, second trimester LR nips  5. Pregnancy with 18 completed weeks gestation  Preterm labor symptoms and general obstetric precautions including but not limited to vaginal  bleeding, contractions, leaking of fluid and fetal movement were reviewed in detail with the patient. Please refer to After Visit Summary for other counseling recommendations.   Return in about 8 weeks (around 01/11/2024) for OB VISIT, MD or APP, babyscripts, 2 hr GTT/28w labs.  Future Appointments  Date Time Provider Department Center  11/16/2023  1:15 PM [facility] NURSE [facility]  11/16/2023  1:30 PM [facility] US5 [facility]    Milas Hock, MD

## 2023-11-16 ENCOUNTER — Encounter: Payer: Self-pay | Admitting: Obstetrics and Gynecology

## 2023-11-16 ENCOUNTER — Ambulatory Visit: Payer: Medicaid Other | Attending: Obstetrics & Gynecology

## 2023-11-16 ENCOUNTER — Ambulatory Visit: Payer: Medicaid Other | Admitting: *Deleted

## 2023-11-16 ENCOUNTER — Encounter: Payer: Self-pay | Admitting: *Deleted

## 2023-11-16 ENCOUNTER — Other Ambulatory Visit: Payer: Self-pay | Admitting: *Deleted

## 2023-11-16 ENCOUNTER — Ambulatory Visit (INDEPENDENT_AMBULATORY_CARE_PROVIDER_SITE_OTHER): Payer: Medicaid Other | Admitting: Obstetrics and Gynecology

## 2023-11-16 ENCOUNTER — Ambulatory Visit: Payer: Medicaid Other | Attending: Obstetrics | Admitting: Obstetrics

## 2023-11-16 VITALS — BP 137/83 | HR 98 | Wt 154.0 lb

## 2023-11-16 VITALS — BP 136/68 | HR 101

## 2023-11-16 DIAGNOSIS — Z3490 Encounter for supervision of normal pregnancy, unspecified, unspecified trimester: Secondary | ICD-10-CM

## 2023-11-16 DIAGNOSIS — E559 Vitamin D deficiency, unspecified: Secondary | ICD-10-CM

## 2023-11-16 DIAGNOSIS — O09523 Supervision of elderly multigravida, third trimester: Secondary | ICD-10-CM

## 2023-11-16 DIAGNOSIS — O09522 Supervision of elderly multigravida, second trimester: Secondary | ICD-10-CM

## 2023-11-16 DIAGNOSIS — Z3A18 18 weeks gestation of pregnancy: Secondary | ICD-10-CM | POA: Diagnosis not present

## 2023-11-16 DIAGNOSIS — O0942 Supervision of pregnancy with grand multiparity, second trimester: Secondary | ICD-10-CM | POA: Diagnosis not present

## 2023-11-16 NOTE — Progress Notes (Signed)
MFM Note  Jillian Obrien is currently at 18 weeks and 5 days.  Jillian Obrien was seen due to advanced maternal age (age 37) and grand multiparity (baby #9).  Jillian Obrien reports 8 prior uncomplicated full-term vaginal deliveries.    Jillian Obrien denies any problems in her current pregnancy.    Jillian Obrien had a cell free DNA test earlier in her pregnancy which indicated a low risk for trisomy 78, 82, and 13. A female fetus is predicted.   Jillian Obrien was informed that the fetal growth and amniotic fluid level were appropriate for her gestational age.   There were no obvious fetal anomalies noted on today's ultrasound exam.  The patient was informed that anomalies may be missed due to technical limitations. If the fetus is in a suboptimal position or maternal habitus is increased, visualization of the fetus in the maternal uterus may be impaired.  The increased risk of fetal aneuploidy due to advanced maternal age was discussed.   Due to advanced maternal age, the patient was offered and declined an amniocentesis today for definitive diagnosis of fetal aneuploidy.  Jillian Obrien is comfortable with her negative cell free DNA test.  Due to advanced maternal age, a follow-up exam was scheduled in the third trimester (in 12 weeks).    The patient stated that all of her questions were answered today.  A total of 45 minutes was spent counseling and coordinating the care for this patient.  Greater than 50% of the time was spent in direct face-to-face contact.

## 2023-11-16 NOTE — Progress Notes (Addendum)
Pt states she had bleeding on 12/25 "like a period" when she wiped but it stopped later that morning- no recent intercourse prior to that

## 2023-11-18 NOTE — L&D Delivery Note (Signed)
    Jillian Obrien is a 38 y.o. [id number] s/p VD at [redacted]w[redacted]d. She was admitted for IOL s/t gHTN.   ROM: 6h 53m with clear fluid GBS Status: Negative Maximum Maternal Temperature: 99  Labor Progress: Patient was initially given one dose oral and vaginal cytotec .  AROM performed and patient with minimal progression. Pitocin  started and titrated to 4mUn/min before patient noted to be complete dilation. Patient encouraged to push and delivered as below with staff and SO support.   Delivery Date/Time: Thursday May 8th, 2025 at 1342 Delivery: Called to room and patient was complete.  After provider gowned and drapes placed, patient encouraged to push.  After ~ 20 minutes head delivered in LOA position with restitution to ROT.  Nuchal cord noted that shoulders and body was delivered through via somersault maneuver. Infant with good tone and terminal meconium, but minimal grimace and was dried and stimulated by provider for ~ 30 seconds before being placed on mother's abdomen.  Infant grimace improved and nurses continued to dry and stimulate.   TXA initiated. Cord clamped x 2 after 3-minute delay, and cut by FOB-Jillian Obrien. Cord blood drawn. Pitocin  initiated and placenta delivered spontaneously with gentle cord traction. Vaginal inspection revealed no lacerations.  Fundus firm, at the umbilicus, and bleeding small.  Mother hemodynamically stable and infant skin to skin prior to provider exit.  Mother declines birth control method and opts to breastfeed.   Family wishes for infant to be circumcised in an outpatient procedure.   Placenta: Intact, Disposal Complications: None Lacerations: None QBL: Analgesia: Epidural  Postpartum Planning -Discharge Summary Shared -PP Message Sent  Infant: Female-Jillian Obrien  APGARs 9, 9  2930g 6lbs 7.2oz, 18in  Jillian Obrien, CNM  03/24/2024 3:04 PM  Delivery Report: Review the Delivery Report for details.

## 2024-01-11 ENCOUNTER — Encounter: Payer: Medicaid Other | Admitting: Obstetrics & Gynecology

## 2024-01-13 ENCOUNTER — Ambulatory Visit: Payer: Medicaid Other | Admitting: Obstetrics and Gynecology

## 2024-01-13 NOTE — Progress Notes (Deleted)
   PRENATAL VISIT NOTE  Subjective:  Jillian Obrien is a 38 y.o. [id number] at [redacted]w[redacted]d being seen today for ongoing prenatal care.  She is currently monitored for the following issues for this high-risk pregnancy and has Vitamin D deficiency; GERD without esophagitis; Precipitous delivery; Supervision of high risk pregnancy; and AMA (advanced maternal age) multigravida 35+, second trimester on their problem list.  Patient reports {sx:14538}.   .  .   . Denies leaking of fluid.   The following portions of the patient's history were reviewed and updated as appropriate: allergies, current medications, past family history, past medical history, past social history, past surgical history and problem list.   Objective:  There were no vitals filed for this visit.  Fetal Status:           General:  Alert, oriented and cooperative. Patient is in no acute distress.  Skin: Skin is warm and dry. No rash noted.   Cardiovascular: Normal heart rate noted  Respiratory: Normal respiratory effort, no problems with respiration noted  Abdomen: Soft, gravid, appropriate for gestational age.         Assessment and Plan:  Pregnancy: [id number] at [redacted]w[redacted]d 1. Encounter for supervision of other normal pregnancy in second trimester (Primary) 2. [redacted] weeks gestation of pregnancy CBC/HIV/RPR today Declines 2h GTT -  Tdap/flu recommended -   3. AMA (advanced maternal age) multigravida 35+, second trimester LR NIPS, normal anatomy Growth [place] scheduled 3/24  4. Grand multipara  {Routine JYNW:29562}  Preterm labor symptoms and general obstetric precautions including but not limited to vaginal bleeding, contractions, leaking of fluid and fetal movement were reviewed in detail with the patient. Please refer to After Visit Summary for other counseling recommendations.   No follow-ups on file.  Future Appointments  Date Time Provider Department Center  01/13/2024  1:50 PM Lennart Pall, MD [facility]   02/08/2024  3:30 PM [facility] US5 [facility]    Lennart Pall, MD

## 2024-01-14 ENCOUNTER — Ambulatory Visit: Payer: Medicaid Other | Admitting: Obstetrics and Gynecology

## 2024-01-14 VITALS — BP 138/84 | HR 106 | Wt 160.0 lb

## 2024-01-14 DIAGNOSIS — Z641 Problems related to multiparity: Secondary | ICD-10-CM

## 2024-01-14 DIAGNOSIS — O09522 Supervision of elderly multigravida, second trimester: Secondary | ICD-10-CM | POA: Diagnosis not present

## 2024-01-14 DIAGNOSIS — Z23 Encounter for immunization: Secondary | ICD-10-CM

## 2024-01-14 DIAGNOSIS — Z3482 Encounter for supervision of other normal pregnancy, second trimester: Secondary | ICD-10-CM

## 2024-01-14 DIAGNOSIS — Z3A27 27 weeks gestation of pregnancy: Secondary | ICD-10-CM | POA: Diagnosis not present

## 2024-01-14 NOTE — Progress Notes (Signed)
   PRENATAL VISIT NOTE  Subjective:  Jillian Obrien is a 38 y.o. [id number] at [redacted]w[redacted]d being seen today for ongoing prenatal care.  She is currently monitored for the following issues for this high-risk pregnancy and has Vitamin D deficiency; GERD without esophagitis; Precipitous delivery; Supervision of high risk pregnancy; and AMA (advanced maternal age) multigravida 35+, second trimester on their problem list.  Patient reports no complaints.  Contractions: Not present. Vag. Bleeding: None.  Movement: Present. Denies leaking of fluid.   The following portions of the patient's history were reviewed and updated as appropriate: allergies, current medications, past family history, past medical history, past social history, past surgical history and problem list.   Objective:   Vitals:   01/14/24 1313  BP: 138/84  Pulse: (!) 106  Weight: 160 lb (72.6 kg)    Fetal Status: Fetal Heart Rate (bpm): 148 Fundal Height: 28 cm Movement: Present     General:  Alert, oriented and cooperative. Patient is in no acute distress.  Skin: Skin is warm and dry. No rash noted.   Cardiovascular: Normal heart rate noted  Respiratory: Normal respiratory effort, no problems with respiration noted  Abdomen: Soft, gravid, appropriate for gestational age.  Pain/Pressure: Absent      Assessment and Plan:  Pregnancy: [id number] at [redacted]w[redacted]d 1. Encounter for supervision of other normal pregnancy in second trimester (Primary) 2. [redacted] weeks gestation of pregnancy 2h GTT - currently declines. Discussed rationale behind screening for GDM and reviewed risks of GDM esp with uncontrolled BG including LGA, shoulder dystocia, birth injury, cesarean section, and stillbirth. Reviewed alternative of 1h for screening. If still declining could consider 2 weeks of accuchecks. She is considering her options.  CBC, HIV, RPR ordered, but she will return for lab visit Tdap today Normal home BP, checking BID. Likes baby scripts optimized  schedule  3. AMA (advanced maternal age) multigravida 35+, second trimester Not on ldASA, only has 1 RF LR NIPS Normal anatomy, growth [place] scheduled 3/24   4. Grand multipara  Please refer to After Visit Summary for other counseling recommendations.   Return for lab visit as soon as possible.  Future Appointments  Date Time Provider Department Center  02/08/2024  3:30 PM [facility] US5 [facility]   Lennart Pall, MD

## 2024-01-18 ENCOUNTER — Ambulatory Visit (INDEPENDENT_AMBULATORY_CARE_PROVIDER_SITE_OTHER): Payer: Medicaid Other

## 2024-01-18 DIAGNOSIS — Z3A27 27 weeks gestation of pregnancy: Secondary | ICD-10-CM | POA: Diagnosis not present

## 2024-01-18 DIAGNOSIS — Z3482 Encounter for supervision of other normal pregnancy, second trimester: Secondary | ICD-10-CM

## 2024-01-18 DIAGNOSIS — Z3A28 28 weeks gestation of pregnancy: Secondary | ICD-10-CM

## 2024-01-18 NOTE — Progress Notes (Signed)
 Lab orders printed and pt sent to lab.

## 2024-01-19 LAB — RPR: RPR Ser Ql: NONREACTIVE

## 2024-01-19 LAB — CBC
Hematocrit: 31.8 % — ABNORMAL LOW (ref 34.0–46.6)
Hemoglobin: 10.6 g/dL — ABNORMAL LOW (ref 11.1–15.9)
MCH: 28.3 pg (ref 26.6–33.0)
MCHC: 33.3 g/dL (ref 31.5–35.7)
MCV: 85 fL (ref 79–97)
Platelets: 248 10*3/uL (ref 150–450)
RBC: 3.74 x10E6/uL — ABNORMAL LOW (ref 3.77–5.28)
RDW: 12.9 % (ref 11.7–15.4)
WBC: 8.7 10*3/uL (ref 3.4–10.8)

## 2024-01-20 ENCOUNTER — Encounter: Payer: Self-pay | Admitting: Obstetrics and Gynecology

## 2024-01-20 LAB — HIV ANTIBODY (ROUTINE TESTING W REFLEX): HIV Screen 4th Generation wRfx: NONREACTIVE

## 2024-01-28 ENCOUNTER — Encounter: Payer: Self-pay | Admitting: *Deleted

## 2024-01-28 DIAGNOSIS — Z641 Problems related to multiparity: Secondary | ICD-10-CM | POA: Insufficient documentation

## 2024-02-08 ENCOUNTER — Ambulatory Visit: Payer: Medicaid Other | Attending: Obstetrics

## 2024-02-08 DIAGNOSIS — Z3A30 30 weeks gestation of pregnancy: Secondary | ICD-10-CM | POA: Diagnosis not present

## 2024-02-08 DIAGNOSIS — O0943 Supervision of pregnancy with grand multiparity, third trimester: Secondary | ICD-10-CM

## 2024-02-08 DIAGNOSIS — O09523 Supervision of elderly multigravida, third trimester: Secondary | ICD-10-CM | POA: Insufficient documentation

## 2024-02-17 ENCOUNTER — Ambulatory Visit: Payer: Medicaid Other | Admitting: Obstetrics and Gynecology

## 2024-02-17 ENCOUNTER — Encounter (HOSPITAL_COMMUNITY): Payer: Self-pay | Admitting: Obstetrics and Gynecology

## 2024-02-17 ENCOUNTER — Inpatient Hospital Stay (HOSPITAL_COMMUNITY)
Admission: AD | Admit: 2024-02-17 | Discharge: 2024-02-17 | Disposition: A | Discharge status: Home / Self Care | Attending: Obstetrics and Gynecology | Admitting: Obstetrics and Gynecology

## 2024-02-17 ENCOUNTER — Other Ambulatory Visit: Payer: Self-pay

## 2024-02-17 VITALS — BP 146/90 | HR 108 | Wt 163.0 lb

## 2024-02-17 DIAGNOSIS — Z3483 Encounter for supervision of other normal pregnancy, third trimester: Secondary | ICD-10-CM

## 2024-02-17 DIAGNOSIS — R030 Elevated blood-pressure reading, without diagnosis of hypertension: Secondary | ICD-10-CM

## 2024-02-17 DIAGNOSIS — O133 Gestational [pregnancy-induced] hypertension without significant proteinuria, third trimester: Secondary | ICD-10-CM | POA: Insufficient documentation

## 2024-02-17 DIAGNOSIS — Z3A32 32 weeks gestation of pregnancy: Secondary | ICD-10-CM

## 2024-02-17 DIAGNOSIS — O09522 Supervision of elderly multigravida, second trimester: Secondary | ICD-10-CM

## 2024-02-17 DIAGNOSIS — Z641 Problems related to multiparity: Secondary | ICD-10-CM | POA: Diagnosis not present

## 2024-02-17 DIAGNOSIS — Z302 Encounter for sterilization: Secondary | ICD-10-CM

## 2024-02-17 DIAGNOSIS — O09523 Supervision of elderly multigravida, third trimester: Secondary | ICD-10-CM | POA: Diagnosis not present

## 2024-02-17 LAB — COMPREHENSIVE METABOLIC PANEL WITH GFR
ALT: 13 U/L (ref 0–44)
AST: 16 U/L (ref 15–41)
Albumin: 2.6 g/dL — ABNORMAL LOW (ref 3.5–5.0)
Alkaline Phosphatase: 89 U/L (ref 38–126)
Anion gap: 9 (ref 5–15)
BUN: 8 mg/dL (ref 6–20)
CO2: 19 mmol/L — ABNORMAL LOW (ref 22–32)
Calcium: 8.4 mg/dL — ABNORMAL LOW (ref 8.9–10.3)
Chloride: 108 mmol/L (ref 98–111)
Creatinine, Ser: 0.53 mg/dL (ref 0.44–1.00)
GFR, Estimated: >60 mL/min (ref >60)
Glucose, Bld: 96 mg/dL (ref 70–99)
Potassium: 3.6 mmol/L (ref 3.5–5.1)
Sodium: 136 mmol/L (ref 135–145)
Total Bilirubin: 0.6 mg/dL (ref 0.0–1.2)
Total Protein: 6 g/dL — ABNORMAL LOW (ref 6.5–8.1)

## 2024-02-17 LAB — CBC
HCT: 31.7 % — ABNORMAL LOW (ref 36.0–46.0)
Hemoglobin: 10.5 g/dL — ABNORMAL LOW (ref 12.0–15.0)
MCH: 27.3 pg (ref 26.0–34.0)
MCHC: 33.1 g/dL (ref 30.0–36.0)
MCV: 82.3 fL (ref 80.0–100.0)
Platelets: 231 10*3/uL (ref 150–400)
RBC: 3.85 MIL/uL — ABNORMAL LOW (ref 3.87–5.11)
RDW: 13.8 % (ref 11.5–15.5)
WBC: 9.9 10*3/uL (ref 4.0–10.5)
nRBC: 0 % (ref 0.0–0.2)

## 2024-02-17 LAB — PROTEIN / CREATININE RATIO, URINE
Creatinine, Urine: 71 mg/dL
Protein Creatinine Ratio: 0.11 mg/mg{creat} (ref 0.00–0.15)
Total Protein, Urine: 8 mg/dL

## 2024-02-17 NOTE — Progress Notes (Unsigned)
   PRENATAL VISIT NOTE  Subjective:  Signora Nanney is a 38 y.o. [id number] at [redacted]w[redacted]d being seen today for ongoing prenatal care.  She is currently monitored for the following issues for this {Blank single:19197::"high-risk","low-risk"} pregnancy and has Vitamin D deficiency; Precipitous delivery; Supervision of high risk pregnancy; AMA (advanced maternal age) multigravida 35+, second trimester; and Grand multipara on their problem list.  Patient reports {sx:14538}.  Contractions: Not present. Vag. Bleeding: None.  Movement: Present. Denies leaking of fluid.   The following portions of the patient's history were reviewed and updated as appropriate: allergies, current medications, past family history, past medical history, past social history, past surgical history and problem list.   Objective:   Vitals:   02/17/24 1421 02/17/24 1431  BP: (!) 146/86 (!) 146/90  Pulse: (!) 108   Weight: 163 lb (73.9 kg)     Fetal Status: Fetal Heart Rate (bpm): 147   Movement: Present     General:  Alert, oriented and cooperative. Patient is in no acute distress.  Skin: Skin is warm and dry. No rash noted.   Cardiovascular: Normal heart rate noted  Respiratory: Normal respiratory effort, no problems with respiration noted  Abdomen: Soft, gravid, appropriate for gestational age.  Pain/Pressure: Absent      Assessment and Plan:  Pregnancy: [id number] at [redacted]w[redacted]d 1. Encounter for supervision of other normal pregnancy in third trimester (Primary) 2. [redacted] weeks gestation of pregnancy Reviewed recommendation for diabetes screening, she opts for *** MOC? Circ? Peds?  3. AMA (advanced maternal age) multigravida 35+, second trimester 4. Grand multipara Not on ldASA, only has 1 RF LR NIPS Normal anatomy, growth @30 /5 1643g (40%), AC 63%, AFI 13.79  5. Elevated blood pressure reading Noted in MFM office 3/24  Preterm labor symptoms and general obstetric precautions including but not limited to vaginal bleeding,  contractions, leaking of fluid and fetal movement were reviewed in detail with the patient. Please refer to After Visit Summary for other counseling recommendations.   No follow-ups on file.  Future Appointments  Date Time Provider Department Center  03/16/2024  2:30 PM Lennart Pall, MD [facility]    Lennart Pall, MD

## 2024-02-17 NOTE — Discharge Instructions (Signed)
 Return to MAU for blood pressure greater than 160 on top number and/or greater than 110 on bottom number.

## 2024-02-17 NOTE — MAU Note (Signed)
 Reactive NST

## 2024-02-17 NOTE — MAU Note (Signed)
 Pt denies regular painful contractions or uterine cramping. + fetal movement. Pt is anxious to be discharged to home.  Denies epigastric pain, visual changes or headache. Labs resulted

## 2024-02-17 NOTE — Progress Notes (Unsigned)
 NST non reactive per Dr.Forsyth

## 2024-02-17 NOTE — MAU Note (Signed)
 Jillian Obrien is a 38 y.o. at [redacted]w[redacted]d here in MAU reporting: sent from OB "to check on the baby because the pattern didn't look right." Denies VB or LOF.  Endorses +FM.  LMP: 07/02/2023 Onset of complaint: today Pain score: 0 Vitals:   02/17/24 1717  BP: 131/72  Pulse: (!) 105  Resp: 18  Temp: 98.4 F (36.9 C)  SpO2: 99%     FHT: 147 bpm  Lab orders placed from triage: None

## 2024-02-17 NOTE — MAU Provider Note (Signed)
 History     CSN: [id number]  Arrival date and time: 02/17/24 1645   Event Date/Time   First Provider Initiated Contact with Patient 02/17/24 1750      Chief Complaint  Patient presents with   Fetal Monitoring   HPI Ms. Jillian Obrien is a 38 y.o. year old [phone] female at [redacted]w[redacted]d weeks gestation who was sent to MAU from the [facility] office for monitoring for nonreactive NST after 45 minutes in office. New diagnosis of gHTN vs preE so if we can do some BP monitoring and collect preE labs/PCR. She states, "He must have been asleep while I was there, because he is always very active and he is very active right now."   OB History     Gravida  10   Para  8   Term  8   Preterm  0   AB  1   Living  8      SAB  1   IAB  0   Ectopic  0   Multiple  0   Live Births  8           Past Medical History:  Diagnosis Date   GERD without esophagitis 03/23/2014   Medical history non-contributory    No pertinent past medical history     Past Surgical History:  Procedure Laterality Date   ABDOMINOPLASTY      Family History  Problem Relation Age of Onset   Diabetes Mother    Hypertension Father     Social History   Tobacco Use   Smoking status: Never   Smokeless tobacco: Never  Vaping Use   Vaping status: Never Used  Substance Use Topics   Alcohol use: No   Drug use: No    Allergies: No Known Allergies  Medications Prior to Admission  Medication Sig Dispense Refill Last Dose/Taking   ondansetron (ZOFRAN) 4 MG tablet Take 1 tablet (4 mg total) by mouth every 8 (eight) hours as needed for nausea or vomiting. (Patient not taking: Reported on 01/14/2024) 40 tablet 0    Prenatal Vit-Fe Fumarate-FA (PRENATAL MULTIVITAMIN) TABS tablet Take 1 tablet by mouth daily at 12 noon.      promethazine (PHENERGAN) 25 MG tablet Take 1 tablet (25 mg total) by mouth every 6 (six) hours as needed for nausea or vomiting. (Patient not taking: Reported on 01/14/2024) 30 tablet 1      Review of Systems  Constitutional: Negative.   HENT: Negative.    Eyes: Negative.   Respiratory: Negative.    Cardiovascular: Negative.   Gastrointestinal: Negative.   Endocrine: Negative.   Genitourinary: Negative.   Musculoskeletal: Negative.   Skin: Negative.   Allergic/Immunologic: Negative.   Neurological: Negative.   Hematological: Negative.   Psychiatric/Behavioral: Negative.     Physical Exam  Patient Vitals for the past 24 hrs:  BP Temp Temp src Pulse Resp SpO2 Height Weight  02/17/24 1916 117/72 -- -- 95 -- -- -- --  02/17/24 1915 -- -- -- -- -- 98 % -- --  02/17/24 1910 -- -- -- -- -- 98 % -- --  02/17/24 1905 -- -- -- -- -- 98 % -- --  02/17/24 1901 118/64 -- -- 97 17 -- -- --  02/17/24 1816 117/60 -- -- 94 -- 98 % -- --  02/17/24 1800 124/69 -- -- (!) 101 -- 97 % -- --  02/17/24 1745 124/65 -- -- 98 -- 98 % -- --  02/17/24 1731 126/70 -- -- Marland Kitchen  107 -- -- -- --  02/17/24 1730 133/81 -- -- (!) 102 -- -- -- --  02/17/24 1717 131/72 98.4 F (36.9 C) Oral (!) 105 18 99 % -- --  02/17/24 1709 -- -- -- -- -- -- 5\' 1"  (1.549 m) 74.2 kg    Physical Exam Vitals and nursing note reviewed.  Constitutional:      Appearance: Normal appearance. She is obese.  Cardiovascular:     Rate and Rhythm: Normal rate.  Pulmonary:     Effort: Pulmonary effort is normal.  Abdominal:     Palpations: Abdomen is soft.  Musculoskeletal:        General: Normal range of motion.  Skin:    General: Skin is warm and dry.  Neurological:     Mental Status: She is alert and oriented to person, place, and time.  Psychiatric:        Mood and Affect: Mood normal.        Behavior: Behavior normal.        Thought Content: Thought content normal.        Judgment: Judgment normal.    REACTIVE NST - FHR: 145 bpm / moderate variability / accels present / decels absent / TOCO: regular every 3-5 mins   MAU Course  Procedures  MDM CCUA CBC CMP P/C Ratio Serial BP's   Results for  orders placed or performed during the hospital encounter of 02/17/24 (from the past 24 hours)  Protein / creatinine ratio, urine     Status: None   Collection Time: 02/17/24  5:53 PM  Result Value Ref Range   Creatinine, Urine 71 mg/dL   Total Protein, Urine 8 mg/dL   Protein Creatinine Ratio 0.11 0.00 - 0.15 mg/mg[Cre]  CBC     Status: Abnormal   Collection Time: 02/17/24  6:24 PM  Result Value Ref Range   WBC 9.9 4.0 - 10.5 K/uL   RBC 3.85 (L) 3.87 - 5.11 MIL/uL   Hemoglobin 10.5 (L) 12.0 - 15.0 g/dL   HCT 16.1 (L) 09.6 - 04.5 %   MCV 82.3 80.0 - 100.0 fL   MCH 27.3 26.0 - 34.0 pg   MCHC 33.1 30.0 - 36.0 g/dL   RDW 40.9 81.1 - 91.4 %   Platelets 231 150 - 400 K/uL   nRBC 0.0 0.0 - 0.2 %  Comprehensive metabolic panel with GFR     Status: Abnormal   Collection Time: 02/17/24  6:24 PM  Result Value Ref Range   Sodium 136 135 - 145 mmol/L   Potassium 3.6 3.5 - 5.1 mmol/L   Chloride 108 98 - 111 mmol/L   CO2 19 (L) 22 - 32 mmol/L   Glucose, Bld 96 70 - 99 mg/dL   BUN 8 6 - 20 mg/dL   Creatinine, Ser 7.82 0.44 - 1.00 mg/dL   Calcium 8.4 (L) 8.9 - 10.3 mg/dL   Total Protein 6.0 (L) 6.5 - 8.1 g/dL   Albumin 2.6 (L) 3.5 - 5.0 g/dL   AST 16 15 - 41 U/L   ALT 13 0 - 44 U/L   Alkaline Phosphatase 89 38 - 126 U/L   Total Bilirubin 0.6 0.0 - 1.2 mg/dL   GFR, Estimated >95 >62 mL/min   Anion gap 9 5 - 15      Assessment and Plan  1. Gestational hypertension, third trimester - Information provided on HTN in pregnancy, how to take BP and form to record BP  - Advised to return to  MAU for BP >/=160/110  2. [redacted] weeks gestation of pregnancy   - Discharge home - Keep scheduled appt with [facility] on 03/16/2024 - Patient verbalized an understanding of the plan of care and agrees.    Raelyn Mora, CNM 02/17/2024, 5:50 PM

## 2024-02-19 DIAGNOSIS — Z302 Encounter for sterilization: Secondary | ICD-10-CM | POA: Insufficient documentation

## 2024-02-19 DIAGNOSIS — O139 Gestational [pregnancy-induced] hypertension without significant proteinuria, unspecified trimester: Secondary | ICD-10-CM | POA: Insufficient documentation

## 2024-02-24 ENCOUNTER — Ambulatory Visit (INDEPENDENT_AMBULATORY_CARE_PROVIDER_SITE_OTHER): Admitting: Obstetrics and Gynecology

## 2024-02-24 ENCOUNTER — Other Ambulatory Visit (HOSPITAL_COMMUNITY)
Admission: RE | Admit: 2024-02-24 | Discharge: 2024-02-24 | Disposition: A | Discharge status: Home / Self Care | Source: Ambulatory Visit | Attending: Obstetrics and Gynecology | Admitting: Obstetrics and Gynecology

## 2024-02-24 ENCOUNTER — Encounter: Payer: Self-pay | Admitting: Obstetrics and Gynecology

## 2024-02-24 VITALS — BP 143/83 | HR 99 | Wt 164.0 lb

## 2024-02-24 DIAGNOSIS — Z302 Encounter for sterilization: Secondary | ICD-10-CM | POA: Diagnosis not present

## 2024-02-24 DIAGNOSIS — O09522 Supervision of elderly multigravida, second trimester: Secondary | ICD-10-CM

## 2024-02-24 DIAGNOSIS — Z3483 Encounter for supervision of other normal pregnancy, third trimester: Secondary | ICD-10-CM | POA: Diagnosis not present

## 2024-02-24 DIAGNOSIS — Z113 Encounter for screening for infections with a predominantly sexual mode of transmission: Secondary | ICD-10-CM | POA: Insufficient documentation

## 2024-02-24 DIAGNOSIS — Z641 Problems related to multiparity: Secondary | ICD-10-CM

## 2024-02-24 DIAGNOSIS — Z3A33 33 weeks gestation of pregnancy: Secondary | ICD-10-CM | POA: Insufficient documentation

## 2024-02-24 DIAGNOSIS — O133 Gestational [pregnancy-induced] hypertension without significant proteinuria, third trimester: Secondary | ICD-10-CM | POA: Insufficient documentation

## 2024-02-24 NOTE — Progress Notes (Signed)
   PRENATAL VISIT NOTE  Subjective:  Jillian Obrien is a 38 y.o. [id number] at [redacted]w[redacted]d being seen today for ongoing prenatal care.  She is currently monitored for the following issues for this high-risk pregnancy and has Vitamin D deficiency; Precipitous delivery; Supervision of high risk pregnancy; AMA (advanced maternal age) multigravida 35+, second trimester; Grand multipara; Gestational hypertension; and Request for sterilization on their problem list.  Patient reports no complaints.  Contractions: Not present. Vag. Bleeding: None.  Movement: Present. Denies leaking of fluid.   The following portions of the patient's history were reviewed and updated as appropriate: allergies, current medications, past family history, past medical history, past social history, past surgical history and problem list.   Objective:   Vitals:   02/24/24 1102  BP: (!) 143/83  Pulse: 99  Weight: 164 lb (74.4 kg)    Fetal Status:     Movement: Present     General:  Alert, oriented and cooperative. Patient is in no acute distress.  Skin: Skin is warm and dry. No rash noted.   Cardiovascular: Normal heart rate noted  Respiratory: Normal respiratory effort, no problems with respiration noted  Abdomen: Soft, gravid, appropriate for gestational age.  Pain/Pressure: Present     Pelvic: Cervical exam performed in the presence of a chaperone        Extremities: Normal range of motion.  Edema: None  Mental Status: Normal mood and affect. Normal behavior. Normal judgment and thought content.   NST: 135, mod variability, + accels, no decels, reactive. Toco - no contractions.   Assessment and Plan:  Pregnancy: [id number] at [redacted]w[redacted]d 1. Encounter for supervision of other normal pregnancy in third trimester (Primary) Cultures done today Scheduled for IOL at 37w (03/23/24). Reviewed indication for induction. Reviewed process. Patient agreeable.   2. Request for sterilization Signed 4/2  3. Precipitous delivery  4.  Grand multipara  5. AMA (advanced maternal age) multigravida 35+, second trimester  6. Pregnancy with 33 completed weeks gestation  7. Gestational hypertension, third trimester BP today mild range.  Weekly NST and labs. NST reactive.  Has [place] tomorrow with MFM  Preterm labor symptoms and general obstetric precautions including but not limited to vaginal bleeding, contractions, leaking of fluid and fetal movement were reviewed in detail with the patient. Please refer to After Visit Summary for other counseling recommendations.   Return in about 1 week (around 03/02/2024) for OB VISIT, MD or APP, NST.  Future Appointments  Date Time Provider Department Center  02/25/2024 10:00 AM [facility] PROVIDER 1 [facility]  02/25/2024 10:30 AM [facility] US6 [facility]  03/02/2024 10:50 AM Lennart Pall, MD [facility]  03/09/2024  9:50 AM Lennart Pall, MD [facility]  03/16/2024  2:30 PM Lennart Pall, MD [facility]  03/23/2024 10:10 AM Lennart Pall, MD [facility]  03/30/2024  9:50 AM Milas Hock, MD [facility]  04/06/2024  9:50 AM Lennart Pall, MD [facility]    Milas Hock, MD

## 2024-02-25 ENCOUNTER — Ambulatory Visit: Attending: Obstetrics and Gynecology

## 2024-02-25 ENCOUNTER — Other Ambulatory Visit

## 2024-02-25 ENCOUNTER — Encounter: Payer: Self-pay | Admitting: Obstetrics and Gynecology

## 2024-02-25 LAB — COMPREHENSIVE METABOLIC PANEL WITH GFR
ALT: 8 IU/L (ref 0–32)
AST: 16 IU/L (ref 0–40)
Albumin: 3.4 g/dL — ABNORMAL LOW (ref 3.9–4.9)
Alkaline Phosphatase: 120 IU/L (ref 44–121)
BUN/Creatinine Ratio: 15 (ref 9–23)
BUN: 7 mg/dL (ref 6–20)
Bilirubin Total: 0.3 mg/dL (ref 0.0–1.2)
CO2: 18 mmol/L — ABNORMAL LOW (ref 20–29)
Calcium: 8.3 mg/dL — ABNORMAL LOW (ref 8.7–10.2)
Chloride: 104 mmol/L (ref 96–106)
Creatinine, Ser: 0.47 mg/dL — ABNORMAL LOW (ref 0.57–1.00)
Globulin, Total: 2.8 g/dL (ref 1.5–4.5)
Glucose: 79 mg/dL (ref 70–99)
Potassium: 4.1 mmol/L (ref 3.5–5.2)
Sodium: 136 mmol/L (ref 134–144)
Total Protein: 6.2 g/dL (ref 6.0–8.5)
eGFR: 125 mL/min/{1.73_m2} (ref >59)

## 2024-02-25 LAB — CERVICOVAGINAL ANCILLARY ONLY
Chlamydia: NEGATIVE
Comment: NEGATIVE
Comment: NORMAL
Neisseria Gonorrhea: NEGATIVE

## 2024-02-25 LAB — CBC
Hematocrit: 30.8 % — ABNORMAL LOW (ref 34.0–46.6)
Hemoglobin: 10.2 g/dL — ABNORMAL LOW (ref 11.1–15.9)
MCH: 26.7 pg (ref 26.6–33.0)
MCHC: 33.1 g/dL (ref 31.5–35.7)
MCV: 81 fL (ref 79–97)
Platelets: 232 10*3/uL (ref 150–450)
RBC: 3.82 x10E6/uL (ref 3.77–5.28)
RDW: 13.8 % (ref 11.7–15.4)
WBC: 7.2 10*3/uL (ref 3.4–10.8)

## 2024-02-26 LAB — STREP GP B NAA: Strep Gp B NAA: NEGATIVE

## 2024-03-02 ENCOUNTER — Ambulatory Visit (INDEPENDENT_AMBULATORY_CARE_PROVIDER_SITE_OTHER): Admitting: Obstetrics and Gynecology

## 2024-03-02 VITALS — BP 141/81 | HR 93 | Wt 163.8 lb

## 2024-03-02 DIAGNOSIS — Z3483 Encounter for supervision of other normal pregnancy, third trimester: Secondary | ICD-10-CM

## 2024-03-02 DIAGNOSIS — O133 Gestational [pregnancy-induced] hypertension without significant proteinuria, third trimester: Secondary | ICD-10-CM | POA: Diagnosis not present

## 2024-03-02 DIAGNOSIS — Z3A34 34 weeks gestation of pregnancy: Secondary | ICD-10-CM

## 2024-03-02 DIAGNOSIS — Z302 Encounter for sterilization: Secondary | ICD-10-CM | POA: Diagnosis not present

## 2024-03-02 DIAGNOSIS — D649 Anemia, unspecified: Secondary | ICD-10-CM | POA: Diagnosis not present

## 2024-03-02 DIAGNOSIS — O09522 Supervision of elderly multigravida, second trimester: Secondary | ICD-10-CM

## 2024-03-02 NOTE — Progress Notes (Signed)
   PRENATAL VISIT NOTE  Subjective:  Jillian Obrien is a 38 y.o. [id number] at [redacted]w[redacted]d being seen today for ongoing prenatal care.  She is currently monitored for the following issues for this high-risk pregnancy and has Vitamin D  deficiency; Precipitous delivery; Supervision of high risk pregnancy; AMA (advanced maternal age) multigravida 35+, second trimester; Grand multipara; Gestational hypertension; Request for sterilization; and Anemia on their problem list.  Patient reports no complaints.  Contractions: Irregular. Vag. Bleeding: None.  Movement: Present. Denies leaking of fluid.   The following portions of the patient's history were reviewed and updated as appropriate: allergies, current medications, past family history, past medical history, past social history, past surgical history and problem list.   Objective:   Vitals:   03/02/24 1109  BP: (!) 141/81  Pulse: 93  Weight: 163 lb 12 oz (74.3 kg)    Fetal Status: Fetal Heart Rate (bpm): 137   Movement: Present     General:  Alert, oriented and cooperative. Patient is in no acute distress.  Skin: Skin is warm and dry. No rash noted.   Cardiovascular: Normal heart rate noted  Respiratory: Normal respiratory effort, no problems with respiration noted  Abdomen: Soft, gravid, appropriate for gestational age.  Pain/Pressure: Present      Assessment and Plan:  Pregnancy: [id number] at [redacted]w[redacted]d 1. [redacted] weeks gestation of pregnancy (Primary) 2. Encounter for supervision of other normal pregnancy in third trimester GBS/GC/CT next visit  3. Gestational hypertension, third trimester Mild range BP in office, home BPs normotensive Weekly labs Weekly antenatal testing. NST today 130bpm, moderate variability, +accels, no decels Needs growth US  - missed her appt, rescheduled for 4/23 IOL at 37 weeks ordered - CBC - Comp Met (CMET) - Protein / creatinine ratio, urine  4. Anemia, unspecified type Hgb 10.3, PO iron  5. AMA (advanced maternal  age) multigravida 35+, second trimester Not on ldASA, only has 1 RF LR NIPS Normal anatomy  6. Request for sterilization Considering BTL. Papers signed 4/2. Has hx abdominoplasty with reconstruction of umbilicus. Discussed that this will likely limit our ability to perform a postpartum tubal and that we can consider LARC/vasectomy or interval tubal   Please refer to After Visit Summary for other counseling recommendations.   Return in about 1 week (around 03/09/2024) for return OB at 35 weeks.  Future Appointments  Date Time Provider Department Center  03/09/2024  9:50 AM Izell Marsh, MD [facility]  03/09/2024  1:45 PM [facility] PROVIDER 1 [facility]  03/09/2024  2:15 PM [facility] NST [facility]  03/09/2024  3:15 PM [facility] US2 [facility]  03/16/2024  2:30 PM Izell Marsh, MD [facility]  03/23/2024  6:45 AM MC-LD SCHED ROOM [facility]-IN[facility] None    Izell Marsh, MD

## 2024-03-03 ENCOUNTER — Encounter (HOSPITAL_COMMUNITY): Payer: Self-pay

## 2024-03-03 ENCOUNTER — Telehealth (HOSPITAL_COMMUNITY): Payer: Self-pay | Admitting: *Deleted

## 2024-03-03 ENCOUNTER — Encounter: Payer: Self-pay | Admitting: Obstetrics and Gynecology

## 2024-03-03 LAB — PROTEIN / CREATININE RATIO, URINE
Creatinine, Urine: 47.3 mg/dL
Protein, Ur: 8.5 mg/dL
Protein/Creat Ratio: 180 mg/g{creat} (ref 0–200)

## 2024-03-03 LAB — CBC
Hematocrit: 30.8 % — ABNORMAL LOW (ref 34.0–46.6)
Hemoglobin: 10.3 g/dL — ABNORMAL LOW (ref 11.1–15.9)
MCH: 27.2 pg (ref 26.6–33.0)
MCHC: 33.4 g/dL (ref 31.5–35.7)
MCV: 81 fL (ref 79–97)
Platelets: 229 10*3/uL (ref 150–450)
RBC: 3.79 x10E6/uL (ref 3.77–5.28)
RDW: 13.9 % (ref 11.7–15.4)
WBC: 5.7 10*3/uL (ref 3.4–10.8)

## 2024-03-03 LAB — COMPREHENSIVE METABOLIC PANEL WITH GFR
ALT: 9 IU/L (ref 0–32)
AST: 14 IU/L (ref 0–40)
Albumin: 3.5 g/dL — ABNORMAL LOW (ref 3.9–4.9)
Alkaline Phosphatase: 131 IU/L — ABNORMAL HIGH (ref 44–121)
BUN/Creatinine Ratio: 15 (ref 9–23)
BUN: 6 mg/dL (ref 6–20)
Bilirubin Total: 0.2 mg/dL (ref 0.0–1.2)
CO2: 19 mmol/L — ABNORMAL LOW (ref 20–29)
Calcium: 8.7 mg/dL (ref 8.7–10.2)
Chloride: 103 mmol/L (ref 96–106)
Creatinine, Ser: 0.4 mg/dL — ABNORMAL LOW (ref 0.57–1.00)
Globulin, Total: 2.6 g/dL (ref 1.5–4.5)
Glucose: 84 mg/dL (ref 70–99)
Potassium: 4 mmol/L (ref 3.5–5.2)
Sodium: 135 mmol/L (ref 134–144)
Total Protein: 6.1 g/dL (ref 6.0–8.5)
eGFR: 130 mL/min/{1.73_m2} (ref >59)

## 2024-03-03 NOTE — Telephone Encounter (Signed)
 Preadmission screen

## 2024-03-07 ENCOUNTER — Encounter: Payer: Self-pay | Admitting: Obstetrics and Gynecology

## 2024-03-09 ENCOUNTER — Telehealth: Payer: Self-pay | Admitting: *Deleted

## 2024-03-09 ENCOUNTER — Other Ambulatory Visit

## 2024-03-09 ENCOUNTER — Ambulatory Visit

## 2024-03-09 ENCOUNTER — Ambulatory Visit: Attending: Obstetrics and Gynecology

## 2024-03-09 ENCOUNTER — Encounter: Admitting: Obstetrics and Gynecology

## 2024-03-09 NOTE — Telephone Encounter (Signed)
 Left patient a message about missed OB appointment.

## 2024-03-10 ENCOUNTER — Telehealth (HOSPITAL_COMMUNITY): Payer: Self-pay | Admitting: *Deleted

## 2024-03-10 NOTE — Telephone Encounter (Signed)
 Preadmission screen

## 2024-03-11 ENCOUNTER — Encounter (HOSPITAL_COMMUNITY): Payer: Self-pay | Admitting: *Deleted

## 2024-03-11 ENCOUNTER — Telehealth (HOSPITAL_COMMUNITY): Payer: Self-pay | Admitting: *Deleted

## 2024-03-11 NOTE — Telephone Encounter (Signed)
 Preadmission screen

## 2024-03-16 ENCOUNTER — Ambulatory Visit: Payer: Medicaid Other | Admitting: Obstetrics and Gynecology

## 2024-03-16 VITALS — BP 142/86 | HR 114 | Wt 165.0 lb

## 2024-03-16 DIAGNOSIS — O09523 Supervision of elderly multigravida, third trimester: Secondary | ICD-10-CM

## 2024-03-16 DIAGNOSIS — Z3A36 36 weeks gestation of pregnancy: Secondary | ICD-10-CM | POA: Diagnosis not present

## 2024-03-16 DIAGNOSIS — Z3483 Encounter for supervision of other normal pregnancy, third trimester: Secondary | ICD-10-CM

## 2024-03-16 DIAGNOSIS — O133 Gestational [pregnancy-induced] hypertension without significant proteinuria, third trimester: Secondary | ICD-10-CM

## 2024-03-16 NOTE — Progress Notes (Signed)
   PRENATAL VISIT NOTE  Subjective:  Jillian Obrien is a 38 y.o. [id number] at [redacted]w[redacted]d being seen today for ongoing prenatal care.  She is currently monitored for the following issues for this high-risk pregnancy and has Vitamin D  deficiency; Precipitous delivery; Supervision of high risk pregnancy; AMA (advanced maternal age) multigravida 35+, second trimester; Grand multipara; Gestational hypertension; Request for sterilization; and Anemia on their problem list.  Patient reports  some contractions & pelvic pain .  Contractions: Irregular. Vag. Bleeding: None.  Movement: Present. Denies leaking of fluid.   The following portions of the patient's history were reviewed and updated as appropriate: allergies, current medications, past family history, past medical history, past social history, past surgical history and problem list.   Objective:   Vitals:   03/16/24 1428 03/16/24 1430  BP: (!) 127/94 (!) 142/86  Pulse: (!) 109 (!) 114  Weight: 74.8 kg     Fetal Status:     Movement: Present     General:  Alert, oriented and cooperative. Patient is in no acute distress.  Skin: Skin is warm and dry. No rash noted.   Cardiovascular: Normal heart rate noted  Respiratory: Normal respiratory effort, no problems with respiration noted  Abdomen: Soft, gravid, appropriate for gestational age.  Pain/Pressure: Absent      Assessment and Plan:  Pregnancy: [id number] at [redacted]w[redacted]d 1. Encounter for supervision of other normal pregnancy in third trimester (Primary) GBS/GC/CT negative 4/9 Undecided on birth control method, signed BTL papers 4/2 but leaning against having as she is concerned about recovery  2. AMA (advanced maternal age) multigravida 35+, third trimester Not on baby aspirin LR NIPS Normal anatomy  3. Gestational hypertension, third trimester -Mild range BP in office, home BPs 120s-100s/60s-70s Weekly labs Weekly antenatal testing. NST today 140bpm, moderate variability, +accels, no  decels IOL at 37 weeks, scheduled for 5/7 - CBC - Comp Met (CMET) - Protein / creatinine ratio, urine   Term labor symptoms and general obstetric precautions including but not limited to vaginal bleeding, contractions, leaking of fluid and fetal movement were reviewed in detail with the patient. Please refer to After Visit Summary for other counseling recommendations.     Future Appointments  Date Time Provider Department Center  03/23/2024  6:45 AM [facility] ROOM [facility] None    Doria Garden, [facility] 03/16/24

## 2024-03-19 ENCOUNTER — Encounter: Payer: Self-pay | Admitting: Obstetrics and Gynecology

## 2024-03-19 LAB — CBC
Hematocrit: 31.8 % — ABNORMAL LOW (ref 34.0–46.6)
Hemoglobin: 10.2 g/dL — ABNORMAL LOW (ref 11.1–15.9)
MCH: 25.6 pg — ABNORMAL LOW (ref 26.6–33.0)
MCHC: 32.1 g/dL (ref 31.5–35.7)
MCV: 80 fL (ref 79–97)
Platelets: 240 10*3/uL (ref 150–450)
RBC: 3.98 x10E6/uL (ref 3.77–5.28)
RDW: 14.2 % (ref 11.7–15.4)
WBC: 7.4 10*3/uL (ref 3.4–10.8)

## 2024-03-19 LAB — COMPREHENSIVE METABOLIC PANEL WITH GFR
ALT: 9 IU/L (ref 0–32)
AST: 15 IU/L (ref 0–40)
Albumin: 3.6 g/dL — ABNORMAL LOW (ref 3.9–4.9)
Alkaline Phosphatase: 158 IU/L — ABNORMAL HIGH (ref 44–121)
BUN/Creatinine Ratio: 16 (ref 9–23)
BUN: 9 mg/dL (ref 6–20)
Bilirubin Total: 0.2 mg/dL (ref 0.0–1.2)
CO2: 17 mmol/L — ABNORMAL LOW (ref 20–29)
Calcium: 9 mg/dL (ref 8.7–10.2)
Chloride: 104 mmol/L (ref 96–106)
Creatinine, Ser: 0.56 mg/dL — ABNORMAL LOW (ref 0.57–1.00)
Globulin, Total: 2.7 g/dL (ref 1.5–4.5)
Glucose: 103 mg/dL — ABNORMAL HIGH (ref 70–99)
Potassium: 4 mmol/L (ref 3.5–5.2)
Sodium: 139 mmol/L (ref 134–144)
Total Protein: 6.3 g/dL (ref 6.0–8.5)
eGFR: 120 mL/min/{1.73_m2} (ref >59)

## 2024-03-19 LAB — PROTEIN / CREATININE RATIO, URINE
Creatinine, Urine: 64.9 mg/dL
Protein, Ur: 10.6 mg/dL
Protein/Creat Ratio: 163 mg/g{creat} (ref 0–200)

## 2024-03-23 ENCOUNTER — Inpatient Hospital Stay (HOSPITAL_COMMUNITY)

## 2024-03-23 ENCOUNTER — Inpatient Hospital Stay (HOSPITAL_COMMUNITY)
Admission: RE | Admit: 2024-03-23 | Discharge: 2024-03-25 | DRG: 807 | Disposition: A | Discharge status: Home / Self Care | Attending: Obstetrics & Gynecology | Admitting: Obstetrics & Gynecology

## 2024-03-23 ENCOUNTER — Encounter: Admitting: Obstetrics and Gynecology

## 2024-03-23 ENCOUNTER — Encounter (HOSPITAL_COMMUNITY): Payer: Self-pay | Admitting: Obstetrics and Gynecology

## 2024-03-23 DIAGNOSIS — Z833 Family history of diabetes mellitus: Secondary | ICD-10-CM

## 2024-03-23 DIAGNOSIS — O09523 Supervision of elderly multigravida, third trimester: Secondary | ICD-10-CM | POA: Diagnosis not present

## 2024-03-23 DIAGNOSIS — Z3A37 37 weeks gestation of pregnancy: Secondary | ICD-10-CM

## 2024-03-23 DIAGNOSIS — O139 Gestational [pregnancy-induced] hypertension without significant proteinuria, unspecified trimester: Principal | ICD-10-CM | POA: Diagnosis present

## 2024-03-23 DIAGNOSIS — K219 Gastro-esophageal reflux disease without esophagitis: Secondary | ICD-10-CM | POA: Diagnosis present

## 2024-03-23 DIAGNOSIS — O09522 Supervision of elderly multigravida, second trimester: Secondary | ICD-10-CM | POA: Diagnosis present

## 2024-03-23 DIAGNOSIS — O134 Gestational [pregnancy-induced] hypertension without significant proteinuria, complicating childbirth: Principal | ICD-10-CM | POA: Diagnosis present

## 2024-03-23 DIAGNOSIS — Z8249 Family history of ischemic heart disease and other diseases of the circulatory system: Secondary | ICD-10-CM

## 2024-03-23 DIAGNOSIS — D649 Anemia, unspecified: Secondary | ICD-10-CM | POA: Diagnosis present

## 2024-03-23 DIAGNOSIS — Z641 Problems related to multiparity: Secondary | ICD-10-CM

## 2024-03-23 DIAGNOSIS — O9962 Diseases of the digestive system complicating childbirth: Secondary | ICD-10-CM | POA: Diagnosis not present

## 2024-03-23 DIAGNOSIS — Z3483 Encounter for supervision of other normal pregnancy, third trimester: Secondary | ICD-10-CM

## 2024-03-23 DIAGNOSIS — O133 Gestational [pregnancy-induced] hypertension without significant proteinuria, third trimester: Secondary | ICD-10-CM

## 2024-03-23 MED ORDER — SODIUM CHLORIDE 0.9 % IV SOLN: 250.0000 mL | INTRAVENOUS | Status: DC | PRN

## 2024-03-23 MED ORDER — TERBUTALINE SULFATE 1 MG/ML IJ SOLN: 0.2500 mg | Freq: Once | INTRAMUSCULAR | Status: DC | PRN

## 2024-03-23 MED ORDER — SODIUM CHLORIDE 0.9% FLUSH: 3.0000 mL | INTRAVENOUS | Status: DC | PRN

## 2024-03-23 MED ORDER — LACTATED RINGERS IV SOLN: 500.0000 mL | INTRAVENOUS | Status: DC | PRN

## 2024-03-23 MED ORDER — MISOPROSTOL 25 MCG QUARTER TABLET
25.0000 ug | ORAL_TABLET | Freq: Once | ORAL | Status: AC
  Administered 2024-03-24: 25 ug via VAGINAL
  Filled 2024-03-23: qty 1

## 2024-03-23 MED ORDER — ONDANSETRON HCL 4 MG/2ML IJ SOLN
4.0000 mg | Freq: Four times a day (QID) | INTRAMUSCULAR | Status: DC | PRN
  Administered 2024-03-24: 4 mg via INTRAVENOUS
  Filled 2024-03-23: qty 2

## 2024-03-23 MED ORDER — ACETAMINOPHEN 325 MG PO TABS: 650.0000 mg | ORAL_TABLET | ORAL | Status: DC | PRN

## 2024-03-23 MED ORDER — FENTANYL CITRATE (PF) 100 MCG/2ML IJ SOLN: 50.0000 ug | INTRAMUSCULAR | Status: DC | PRN

## 2024-03-23 MED ORDER — OXYTOCIN BOLUS FROM INFUSION
333.0000 mL | Freq: Once | INTRAVENOUS | Status: AC
  Administered 2024-03-24: 333 mL via INTRAVENOUS

## 2024-03-23 MED ORDER — SOD CITRATE-CITRIC ACID 500-334 MG/5ML PO SOLN: 30.0000 mL | ORAL | Status: DC | PRN

## 2024-03-23 MED ORDER — OXYTOCIN-SODIUM CHLORIDE 30-0.9 UT/500ML-% IV SOLN
2.5000 [IU]/h | INTRAVENOUS | Status: DC
  Filled 2024-03-23: qty 500

## 2024-03-23 MED ORDER — SODIUM CHLORIDE 0.9% FLUSH: 3.0000 mL | Freq: Two times a day (BID) | INTRAVENOUS | Status: DC

## 2024-03-23 MED ORDER — LIDOCAINE HCL (PF) 1 % IJ SOLN: 30.0000 mL | INTRAMUSCULAR | Status: DC | PRN

## 2024-03-23 MED ORDER — MISOPROSTOL 50MCG HALF TABLET
50.0000 ug | ORAL_TABLET | Freq: Once | ORAL | Status: AC
  Administered 2024-03-24: 50 ug via ORAL
  Filled 2024-03-23: qty 1

## 2024-03-24 ENCOUNTER — Inpatient Hospital Stay (HOSPITAL_COMMUNITY): Admitting: Anesthesiology

## 2024-03-24 ENCOUNTER — Other Ambulatory Visit: Payer: Self-pay

## 2024-03-24 DIAGNOSIS — Z3A37 37 weeks gestation of pregnancy: Secondary | ICD-10-CM | POA: Diagnosis not present

## 2024-03-24 DIAGNOSIS — O09523 Supervision of elderly multigravida, third trimester: Secondary | ICD-10-CM | POA: Diagnosis not present

## 2024-03-24 DIAGNOSIS — O134 Gestational [pregnancy-induced] hypertension without significant proteinuria, complicating childbirth: Secondary | ICD-10-CM | POA: Diagnosis not present

## 2024-03-24 LAB — CBC
HCT: 30.4 % — ABNORMAL LOW (ref 36.0–46.0)
Hemoglobin: 9.8 g/dL — ABNORMAL LOW (ref 12.0–15.0)
MCH: 25.7 pg — ABNORMAL LOW (ref 26.0–34.0)
MCHC: 32.2 g/dL (ref 30.0–36.0)
MCV: 79.6 fL — ABNORMAL LOW (ref 80.0–100.0)
Platelets: 223 10*3/uL (ref 150–400)
RBC: 3.82 MIL/uL — ABNORMAL LOW (ref 3.87–5.11)
RDW: 14.4 % (ref 11.5–15.5)
WBC: 7.6 10*3/uL (ref 4.0–10.5)
nRBC: 0 % (ref 0.0–0.2)

## 2024-03-24 LAB — TYPE AND SCREEN
ABO/RH(D): A POS
Antibody Screen: NEGATIVE

## 2024-03-24 LAB — COMPREHENSIVE METABOLIC PANEL WITH GFR
ALT: 12 U/L (ref 0–44)
AST: 17 U/L (ref 15–41)
Albumin: 2.5 g/dL — ABNORMAL LOW (ref 3.5–5.0)
Alkaline Phosphatase: 117 U/L (ref 38–126)
Anion gap: 10 (ref 5–15)
BUN: 8 mg/dL (ref 6–20)
CO2: 19 mmol/L — ABNORMAL LOW (ref 22–32)
Calcium: 8.6 mg/dL — ABNORMAL LOW (ref 8.9–10.3)
Chloride: 107 mmol/L (ref 98–111)
Creatinine, Ser: 0.58 mg/dL (ref 0.44–1.00)
GFR, Estimated: >60 mL/min (ref >60)
Glucose, Bld: 98 mg/dL (ref 70–99)
Potassium: 3.7 mmol/L (ref 3.5–5.1)
Sodium: 136 mmol/L (ref 135–145)
Total Bilirubin: 0.6 mg/dL (ref 0.0–1.2)
Total Protein: 6.1 g/dL — ABNORMAL LOW (ref 6.5–8.1)

## 2024-03-24 LAB — RPR: RPR Ser Ql: NONREACTIVE

## 2024-03-24 MED ORDER — BENZOCAINE-MENTHOL 20-0.5 % EX AERO
1.0000 | INHALATION_SPRAY | CUTANEOUS | Status: DC | PRN
  Administered 2024-03-24: 1 via TOPICAL
  Filled 2024-03-24: qty 56

## 2024-03-24 MED ORDER — TRANEXAMIC ACID-NACL 1000-0.7 MG/100ML-% IV SOLN
1000.0000 mg | Freq: Once | INTRAVENOUS | Status: AC
  Administered 2024-03-24: 1000 mg via INTRAVENOUS

## 2024-03-24 MED ORDER — OXYCODONE HCL 5 MG PO TABS: 10.0000 mg | ORAL_TABLET | ORAL | Status: DC | PRN

## 2024-03-24 MED ORDER — ACETAMINOPHEN 325 MG PO TABS
650.0000 mg | ORAL_TABLET | ORAL | Status: DC | PRN
  Administered 2024-03-24 – 2024-03-25 (×3): 650 mg via ORAL
  Filled 2024-03-24 (×3): qty 2

## 2024-03-24 MED ORDER — SENNOSIDES-DOCUSATE SODIUM 8.6-50 MG PO TABS
2.0000 | ORAL_TABLET | ORAL | Status: DC
  Administered 2024-03-25: 2 via ORAL
  Filled 2024-03-24: qty 2

## 2024-03-24 MED ORDER — WITCH HAZEL-GLYCERIN EX PADS: 1.0000 | MEDICATED_PAD | CUTANEOUS | Status: DC | PRN

## 2024-03-24 MED ORDER — ZOLPIDEM TARTRATE 5 MG PO TABS: 5.0000 mg | ORAL_TABLET | Freq: Every evening | ORAL | Status: DC | PRN

## 2024-03-24 MED ORDER — COCONUT OIL OIL: 1.0000 | TOPICAL_OIL | Status: DC | PRN

## 2024-03-24 MED ORDER — OXYTOCIN-SODIUM CHLORIDE 30-0.9 UT/500ML-% IV SOLN
1.0000 m[IU]/min | INTRAVENOUS | Status: DC
  Administered 2024-03-24: 1 m[IU]/min via INTRAVENOUS

## 2024-03-24 MED ORDER — PHENYLEPHRINE 80 MCG/ML (10ML) SYRINGE FOR IV PUSH (FOR BLOOD PRESSURE SUPPORT): 80.0000 ug | PREFILLED_SYRINGE | INTRAVENOUS | Status: DC | PRN

## 2024-03-24 MED ORDER — FENTANYL-BUPIVACAINE-NACL 0.5-0.125-0.9 MG/250ML-% EP SOLN
12.0000 mL/h | EPIDURAL | Status: DC | PRN
  Filled 2024-03-24: qty 250

## 2024-03-24 MED ORDER — DIPHENHYDRAMINE HCL 25 MG PO CAPS: 25.0000 mg | ORAL_CAPSULE | Freq: Four times a day (QID) | ORAL | Status: DC | PRN

## 2024-03-24 MED ORDER — DIBUCAINE (PERIANAL) 1 % EX OINT
1.0000 | TOPICAL_OINTMENT | CUTANEOUS | Status: DC | PRN
  Filled 2024-03-24 (×2): qty 28

## 2024-03-24 MED ORDER — TETANUS-DIPHTH-ACELL PERTUSSIS 5-2.5-18.5 LF-MCG/0.5 IM SUSY: 0.5000 mL | PREFILLED_SYRINGE | Freq: Once | INTRAMUSCULAR | Status: DC

## 2024-03-24 MED ORDER — EPHEDRINE 5 MG/ML INJ: 10.0000 mg | INTRAVENOUS | Status: DC | PRN

## 2024-03-24 MED ORDER — TERBUTALINE SULFATE 1 MG/ML IJ SOLN: 0.2500 mg | Freq: Once | INTRAMUSCULAR | Status: DC | PRN

## 2024-03-24 MED ORDER — SIMETHICONE 80 MG PO CHEW: 80.0000 mg | CHEWABLE_TABLET | ORAL | Status: DC | PRN

## 2024-03-24 MED ORDER — FERROUS SULFATE 325 (65 FE) MG PO TABS
325.0000 mg | ORAL_TABLET | Freq: Every day | ORAL | Status: DC
  Administered 2024-03-25: 325 mg via ORAL
  Filled 2024-03-24: qty 1

## 2024-03-24 MED ORDER — LACTATED RINGERS IV SOLN: 500.0000 mL | Freq: Once | INTRAVENOUS | Status: DC

## 2024-03-24 MED ORDER — ONDANSETRON HCL 4 MG PO TABS: 4.0000 mg | ORAL_TABLET | ORAL | Status: DC | PRN

## 2024-03-24 MED ORDER — OXYCODONE HCL 5 MG PO TABS
5.0000 mg | ORAL_TABLET | ORAL | Status: DC | PRN
  Administered 2024-03-24 – 2024-03-25 (×3): 5 mg via ORAL
  Filled 2024-03-24 (×3): qty 1

## 2024-03-24 MED ORDER — IBUPROFEN 600 MG PO TABS
600.0000 mg | ORAL_TABLET | Freq: Four times a day (QID) | ORAL | Status: DC
  Administered 2024-03-24 – 2024-03-25 (×3): 600 mg via ORAL
  Filled 2024-03-24 (×5): qty 1

## 2024-03-24 MED ORDER — PRENATAL MULTIVITAMIN CH
1.0000 | ORAL_TABLET | Freq: Every day | ORAL | Status: DC
  Administered 2024-03-25: 1 via ORAL
  Filled 2024-03-24: qty 1

## 2024-03-24 MED ORDER — ONDANSETRON HCL 4 MG/2ML IJ SOLN: 4.0000 mg | INTRAMUSCULAR | Status: DC | PRN

## 2024-03-24 MED ORDER — DIPHENHYDRAMINE HCL 50 MG/ML IJ SOLN: 12.5000 mg | INTRAMUSCULAR | Status: DC | PRN

## 2024-03-24 MED ORDER — LACTATED RINGERS IV SOLN: INTRAVENOUS | Status: DC

## 2024-03-24 NOTE — Anesthesia Procedure Notes (Signed)
 Epidural Patient location during procedure: OB Start time: 03/24/2024 3:43 AM End time: 03/24/2024 3:52 AM  Staffing Anesthesiologist: Jacquelyne Matte, DO Performed: anesthesiologist   Preanesthetic Checklist Completed: patient identified, IV checked, risks and benefits discussed, monitors and equipment checked, pre-op evaluation and timeout performed  Epidural Patient position: sitting Prep: DuraPrep and site prepped and draped Patient monitoring: continuous pulse ox, blood pressure, heart rate and cardiac monitor Approach: midline Location: L3-L4 Injection technique: LOR air  Needle:  Needle type: Tuohy  Needle gauge: 17 G Needle length: 9 cm Needle insertion depth: 5.5 cm Catheter type: closed end flexible Catheter size: 19 Gauge Catheter at skin depth: 11 cm Test dose: negative  Assessment Sensory level: T8 Events: blood not aspirated, no cerebrospinal fluid, injection not painful, no injection resistance, no paresthesia and negative IV test  Additional Notes Patient identified. Risks/Benefits/Options discussed with patient including but not limited to bleeding, infection, nerve damage, paralysis, failed block, incomplete pain control, headache, blood pressure changes, nausea, vomiting, reactions to medication both or allergic, itching and postpartum back pain. Confirmed with bedside nurse the patient's most recent platelet count. Confirmed with patient that they are not currently taking any anticoagulation, have any bleeding history or any family history of bleeding disorders. Patient expressed understanding and wished to proceed. All questions were answered. Sterile technique was used throughout the entire procedure. Please see nursing notes for vital signs. Test dose was given through epidural catheter and negative prior to continuing to dose epidural or start infusion. Warning signs of high block given to the patient including shortness of breath, tingling/numbness in  hands, complete motor block, or any concerning symptoms with instructions to call for help. Patient was given instructions on fall risk and not to get out of bed. All questions and concerns addressed with instructions to call with any issues or inadequate analgesia.  Reason for block:procedure for pain

## 2024-03-24 NOTE — Lactation Note (Signed)
 This note was copied from a baby's chart. Lactation Consultation Note  Patient Name: Jillian Obrien [id number] Date: 03/24/2024 Age:38 hours Reason for consult: Initial assessment  Per [name]'s MBU RN, [name] does not want to see the lactation team during her entire stay at the hospital. Please let our team know if anything changes and [name] is requesting assistance.  Feeding Mother's Current Feeding Choice: Breast Milk and Formula  Consult Status Consult Status: Complete (mother declined follow up) Date: 03/25/24    Vernette Goo BS, IBCLC 03/24/2024, 3:51 PM

## 2024-03-24 NOTE — H&P (Signed)
 Jillian Obrien is a 38 y.o. [id number] female at [redacted]w[redacted]d by [redacted]w[redacted]d u/s presenting for IOL due to gHTN.   Reports active fetal movement, contractions: irreg/mild, vaginal bleeding: none, membranes: intact.  Initiated prenatal care at [facility] at 10.4 wks.   Most recent u/s: [redacted]w[redacted]d, EFW 40%, AFI 13cm, post placenta, cephalic.   This pregnancy complicated by: # gHTN- dx ~32wks # grand multip # AMA # considering BTL (hx abdominoplasty w reconstruction of umbilicus; note 4/2 says may need to be interval)  Prenatal History/Complications:  # SVD x 8 without complication  Past Medical History: Past Medical History:  Diagnosis Date   GERD without esophagitis 03/23/2014   Medical history non-contributory    No pertinent past medical history    Pregnancy induced hypertension     Past Surgical History: Past Surgical History:  Procedure Laterality Date   ABDOMINOPLASTY      Obstetrical History: OB History     Gravida  10   Para  8   Term  8   Preterm  0   AB  1   Living  8      SAB  1   IAB  0   Ectopic  0   Multiple  0   Live Births  8           Social History: Social History   Socioeconomic History   Marital status: Married    Spouse name: Not on file   Number of children: Not on file   Years of education: Not on file   Highest education level: Not on file  Occupational History   Not on file  Tobacco Use   Smoking status: Never   Smokeless tobacco: Never  Vaping Use   Vaping status: Never Used  Substance and Sexual Activity   Alcohol use: No   Drug use: No   Sexual activity: Yes    Birth control/protection: None  Other Topics Concern   Not on file  Social History Narrative   Not on file   Social Drivers of Health   Financial Resource Strain: Low Risk  (12/27/2018)   Overall Financial Resource Strain (CARDIA)    Difficulty of Paying Living Expenses: Not hard at all  Food Insecurity: Unknown (12/27/2018)   Hunger Vital Sign    Worried About  Running Out of Food in the Last Year: Patient declined    Ran Out of Food in the Last Year: Patient declined  Transportation Needs: Unknown (12/27/2018)   PRAPARE - Administrator, Civil Service (Medical): Patient declined    Lack of Transportation (Non-Medical): Patient declined  Physical Activity: Unknown (12/27/2018)   Exercise Vital Sign    Days of Exercise per Week: Patient declined    Minutes of Exercise per Session: Patient declined  Stress: No Stress Concern Present (12/27/2018)   [employer] of Occupational Health - Occupational Stress Questionnaire    Feeling of Stress : Not at all  Social Connections: Unknown (12/27/2018)   Social Connection and Isolation Panel [NHANES]    Frequency of Communication with Friends and Family: Patient declined    Frequency of Social Gatherings with Friends and Family: Patient declined    Attends Religious Services: Patient declined    Database administrator or Organizations: Patient declined    Attends Banker Meetings: Patient declined    Marital Status: Patient declined    Family History: Family History  Problem Relation Age of Onset   Diabetes Mother  Hypertension Father     Allergies: No Known Allergies  Medications Prior to Admission  Medication Sig Dispense Refill Last Dose/Taking   Prenatal Vit-Fe Fumarate-FA (PRENATAL MULTIVITAMIN) TABS tablet Take 1 tablet by mouth daily at 12 noon.   Past Week    Review of Systems  Pertinent pos/neg as indicated in HPI  Blood pressure (!) 144/71, pulse (!) 109, temperature 98.3 F (36.8 C), temperature source Oral, resp. rate 16, height 5\' 1"  (1.549 m), weight 75.4 kg, last menstrual period 07/02/2023, SpO2 99%, unknown if currently breastfeeding. General appearance: alert, cooperative, and no distress Lungs: clear to auscultation bilaterally Heart: regular rate and rhythm Abdomen: gravid, soft, non-tender, EFW by Leopold's approximately 7lbs Extremities: tr  edema  Fetal monitoring: FHR: 130s bpm, variability: moderate,  Accelerations: Present,  decelerations:  Absent Uterine activity: irreg, mild   Presentation: cephalic   Prenatal labs: ABO, Rh: A/Positive/-- (10/28 1147) Antibody: Negative (10/28 1147) Rubella: 1.39 (10/28 1147) RPR: Non Reactive (03/03 1105)  HBsAg: Negative (10/28 1147)  HIV: Non Reactive (03/03 1640)  GBS: Negative/-- (04/09 1140)  2hr GTT: declined  Prenatal Transfer Tool  Maternal Diabetes: No (didn't do screening) Genetic Screening: Normal Maternal Ultrasounds/Referrals: Normal Fetal Ultrasounds or other Referrals:  None Maternal Substance Abuse:  No Significant Maternal Medications:  None Significant Maternal Lab Results: Group B Strep negative  No results found for this or any previous visit (from the past 24 hours).   Assessment:  [redacted]w[redacted]d SIUP  [id number]  gHTN (mild range BPs; neg labs)  Cat 1 FHR  GBS Negative/-- (04/09 1140)  Plan:  Admit to L&D  IV pain meds/epidural prn active labor  Plan cx ripening w dual cytotec  to start, and then plan for Pitocin /AROM when able  Anticipate vag delivery   Plans to breastfeed  Contraception: considering BTL, pp v interval; (30d papers signed 02/17/24)  Circumcision: yes  Jolayne Natter CNM 03/24/2024, 12:03 AM

## 2024-03-24 NOTE — Progress Notes (Addendum)
 Patient ID: Jillian Obrien, female   DOB: January 16, 1986, 38 y.o.   MRN: [id number]  Jillian Obrien MRN: [id number]  Subjective: -Care assumed of 38 y.o. [id number] at [redacted]w[redacted]d who presents for IOL s/t gHTN. In room to meet acquaintance of patient and family.  Patient asleep and provider did not attempt to awaken.  SO-Dominick at bedside and without questions.   Objective: BP 117/86   Pulse 82   Temp 98.4 F (36.9 C) (Oral)   Resp 16   Ht 5\' 1"  (1.549 m)   Wt 75.4 kg   LMP 07/02/2023   SpO2 99%   BMI 31.42 kg/m  No intake/output data recorded. No intake/output data recorded.  Fetal Monitoring: FHT: 130 bpm, Mod Var, -Decels, +15x15 Accels UC: Irritability graphed  Physical Exam: Deferred  Vaginal Exam: SVE:   Dilation: 4 Effacement (%): 90 Station: -2 Exam by:: S. Warren-Hill, CNM Membranes:AROM Internal Monitors: None  Augmentation/Induction: Pitocin :None Cytotec : S/p One Dose at 0045  Assessment:  IUP at 37.1 weeks Cat I FT  gHTN GBS Negative AROM x 2 hours  Plan: -Consider pitocin  initiation if no cervical change at next exam. -Continue other mgmt as ordered. -Plan for TXA at delivery considering grandmultip status.    Wynell Heath Jaycion Treml,MSN, CNM 03/24/2024, 9:15 AM  Addendum 10:13 AM -Nurse reports patient with c/o pressure, but cervical exam remains unchanged. -Start pitocin  at 1x1 and will consider 25mUn/min increase once at 8mUn/min. -NST remains reactive.  Kraig Peru MSN, CNM Advanced Practice Provider, [facility] for [employer]

## 2024-03-24 NOTE — H&P (Incomplete)
 Jillian Obrien is a 38 y.o. [id number] female at [redacted]w[redacted]d by *** presenting ***.   Reports {Fetal Movement:20184} fetal movement, contractions: {obgyn contractions reg/irreg:312982}, vaginal bleeding: {desc; vaginal bleeding:14141}, membranes: {pe membranes intrapartum:314523}.  Initiated prenatal care at *** at *** wks.   Most recent u/s ***.   This pregnancy complicated by: ***  Prenatal History/Complications:  ***  Past Medical History: Past Medical History:  Diagnosis Date  . GERD without esophagitis 03/23/2014  . Medical history non-contributory   . No pertinent past medical history   . Pregnancy induced hypertension     Past Surgical History: Past Surgical History:  Procedure Laterality Date  . ABDOMINOPLASTY      Obstetrical History: OB History     Gravida  10   Para  8   Term  8   Preterm  0   AB  1   Living  8      SAB  1   IAB  0   Ectopic  0   Multiple  0   Live Births  8           Social History: Social History   Socioeconomic History  . Marital status: Married    Spouse name: Not on file  . Number of children: Not on file  . Years of education: Not on file  . Highest education level: Not on file  Occupational History  . Not on file  Tobacco Use  . Smoking status: Never  . Smokeless tobacco: Never  Vaping Use  . Vaping status: Never Used  Substance and Sexual Activity  . Alcohol use: No  . Drug use: No  . Sexual activity: Yes    Birth control/protection: None  Other Topics Concern  . Not on file  Social History Narrative  . Not on file   Social Drivers of Health   Financial Resource Strain: Low Risk  (12/27/2018)   Overall Financial Resource Strain (CARDIA)   . Difficulty of Paying Living Expenses: Not hard at all  Food Insecurity: Unknown (12/27/2018)   Hunger Vital Sign   . Worried About Programme researcher, broadcasting/film/video in the Last Year: Patient declined   . Ran Out of Food in the Last Year: Patient declined  Transportation  Needs: Unknown (12/27/2018)   PRAPARE - Transportation   . Lack of Transportation (Medical): Patient declined   . Lack of Transportation (Non-Medical): Patient declined  Physical Activity: Unknown (12/27/2018)   Exercise Vital Sign   . Days of Exercise per Week: Patient declined   . Minutes of Exercise per Session: Patient declined  Stress: No Stress Concern Present (12/27/2018)   [employer] of Occupational Health - Occupational Stress Questionnaire   . Feeling of Stress : Not at all  Social Connections: Unknown (12/27/2018)   Social Connection and Isolation Panel [NHANES]   . Frequency of Communication with Friends and Family: Patient declined   . Frequency of Social Gatherings with Friends and Family: Patient declined   . Attends Religious Services: Patient declined   . Active Member of Clubs or Organizations: Patient declined   . Attends Banker Meetings: Patient declined   . Marital Status: Patient declined    Family History: Family History  Problem Relation Age of Onset  . Diabetes Mother   . Hypertension Father     Allergies: No Known Allergies  Medications Prior to Admission  Medication Sig Dispense Refill Last Dose/Taking  . Prenatal Vit-Fe Fumarate-FA (PRENATAL MULTIVITAMIN) TABS tablet Take 1 tablet by  mouth daily at 12 noon.   Past Week    Review of Systems  Pertinent pos/neg as indicated in HPI  Blood pressure (!) 144/71, pulse (!) 109, temperature 98.3 F (36.8 C), temperature source Oral, resp. rate 16, height 5\' 1"  (1.549 m), weight 75.4 kg, last menstrual period 07/02/2023, SpO2 99%, unknown if currently breastfeeding. General appearance: {general exam:16600} Lungs: clear to auscultation bilaterally Heart: regular rate and rhythm Abdomen: gravid, soft, non-tender, EFW by Leopold's approximately ***lbs Extremities: *** edema DTR's ***  Fetal monitoring: FHR: *** bpm, variability: {fhr variability:21617},   Accelerations: {Accelerations:21618},  decelerations:  {fhr decel present:21619} Uterine activity: {Uterine contractions:31516}   Presentation: {desc; fetal presentation:14558}   Prenatal labs: ABO, Rh: A/Positive/-- (10/28 1147) Antibody: Negative (10/28 1147) Rubella: 1.39 (10/28 1147) RPR: Non Reactive (03/03 1105)  HBsAg: Negative (10/28 1147)  HIV: Non Reactive (03/03 1640)  GBS: Negative/-- (04/09 1140)  2hr GTT: ***  Prenatal Transfer Tool  Maternal Diabetes: {Maternal Diabetes:3043596} Genetic Screening: {Genetic Screening:20205} Maternal Ultrasounds/Referrals: {Maternal Ultrasounds / Referrals:20211} Fetal Ultrasounds or other Referrals:  {Fetal Ultrasounds or Other Referrals:20213} Maternal Substance Abuse:  {Maternal Substance Abuse:20223} Significant Maternal Medications:  {Significant Maternal Meds:20233} Significant Maternal Lab Results: {Significant Maternal Lab Results:20235}  No results found for this or any previous visit (from the past 24 hours).   Assessment:  [redacted]w[redacted]d SIUP  [id number]  ***  Cat *** FHR  GBS Negative/-- (04/09 1140)  Plan:  Admit to L&D  IV pain meds/epidural prn active labor  ***  Anticipate ***   Plans to ***feed  Contraception: ***  Circumcision: ***  Jolayne Natter CNM 03/24/2024, 12:03 AM

## 2024-03-24 NOTE — Anesthesia Preprocedure Evaluation (Signed)
 Anesthesia Evaluation  Patient identified by MRN, date of birth, ID band Patient awake    Reviewed: Allergy & Precautions, H&P , NPO status , Patient's Chart, lab work & pertinent test results  Airway Mallampati: II  TM Distance: >3 FB Neck ROM: Full    Dental no notable dental hx.    Pulmonary neg pulmonary ROS   Pulmonary exam normal breath sounds clear to auscultation       Cardiovascular hypertension, Normal cardiovascular exam Rhythm:Regular Rate:Normal     Neuro/Psych negative neurological ROS  negative psych ROS   GI/Hepatic Neg liver ROS,GERD  ,,  Endo/Other  BMI 31  Renal/GU negative Renal ROS  negative genitourinary   Musculoskeletal negative musculoskeletal ROS (+)    Abdominal   Peds negative pediatric ROS (+)  Hematology  (+) Blood dyscrasia, anemia Hb 9.8, plt 223   Anesthesia Other Findings   Reproductive/Obstetrics negative OB ROS                             Anesthesia Physical Anesthesia Plan  ASA: 2  Anesthesia Plan: Epidural   Post-op Pain Management:    Induction:   PONV Risk Score and Plan: 2  Airway Management Planned: Natural Airway  Additional Equipment: None  Intra-op Plan:   Post-operative Plan:   Informed Consent: I have reviewed the patients History and Physical, chart, labs and discussed the procedure including the risks, benefits and alternatives for the proposed anesthesia with the patient or authorized representative who has indicated his/her understanding and acceptance.       Plan Discussed with:   Anesthesia Plan Comments:        Anesthesia Quick Evaluation

## 2024-03-24 NOTE — Anesthesia Postprocedure Evaluation (Signed)
 Anesthesia Post Note  Patient: Jillian Obrien  Procedure(s) Performed: AN AD HOC LABOR EPIDURAL     Patient location during evaluation: Mother Baby Anesthesia Type: Epidural Level of consciousness: awake and alert and oriented Pain management: satisfactory to patient Vital Signs Assessment: post-procedure vital signs reviewed and stable Respiratory status: respiratory function stable Cardiovascular status: stable Postop Assessment: no headache, no backache, epidural receding, patient able to bend at knees, no signs of nausea or vomiting, adequate PO intake and able to ambulate Anesthetic complications: no   No notable events documented.  Last Vitals:  Vitals:   03/24/24 1540 03/24/24 1650  BP: 111/64 (!) 114/59  Pulse: 72 66  Resp: 17 18  Temp: 36.9 C 36.9 C  SpO2: 98% 100%    Last Pain:  Vitals:   03/24/24 1846  TempSrc:   PainSc: 9    Pain Goal:                   Bernell Brigham, CRNA

## 2024-03-24 NOTE — Discharge Summary (Signed)
 Postpartum Discharge Summary       Patient Name: Jillian Obrien DOB: 02-Jan-1986 MRN: [id number]  Date of admission: 03/23/2024 Delivery date:03/24/2024 Delivering provider: Loetta Ringer Date of discharge: 03/25/2024  Admitting diagnosis: Gestational hypertension [O13.9] Intrauterine pregnancy: [redacted]w[redacted]d     Secondary diagnosis:  Principal Problem:   Vaginal delivery Active Problems:   AMA (advanced maternal age) multigravida 35+, second trimester   Grand multipara   Gestational hypertension   Anemia  Additional problems: NA    Discharge diagnosis: Term Pregnancy Delivered, Gestational Hypertension, and Anemia                                              Post partum procedures:None Augmentation: N/A Complications: None  Hospital course: Induction of Labor With Vaginal Delivery   38 y.o. yo [id number] at [redacted]w[redacted]d was admitted to the hospital 03/23/2024 for induction of labor.  Indication for induction: Gestational hypertension.  Patient had an labor/pregnancy course complicated by anemia, grandmultiparity, and AMA. Patient given cytotec  and then AROM occurred. She made minimal change and initiation of pitocin  with complete dilation.  Patient delivered without incident.  Membrane Rupture Time/Date: 7:36 AM,03/24/2024  Delivery Method:Vaginal, Spontaneous Operative Delivery:N/A Episiotomy: None Lacerations:  None Details of delivery can be found in separate delivery note.  Patient had a postpartum course complicated by nothing. Patient is discharged home 03/25/24.  Newborn Data: Birth date:03/24/2024 Birth time:1:42 PM Gender:Female-Jacob Living status:Living Apgars:9 ,9  Weight:2930 g  Magnesium  Sulfate received: No BMZ received: No Rhophylac:N/A MMR:No T-DaP:Given prenatally Flu: No RSV Vaccine received: No Transfusion:No  Immunizations received: Immunization History  Administered Date(s) Administered   Influenza,inj,Quad PF,6+ Mos 09/05/2013, 12/29/2018   Tdap 10/19/2011,  07/25/2017, 12/29/2018, 01/14/2024    Physical exam  Vitals:   03/24/24 2050 03/25/24 0050 03/25/24 0450 03/25/24 0606  BP: 112/60 117/68 121/71 122/82  Pulse: 66 70 68   Resp: 18 18 18 18   Temp: 98.1 F (36.7 C) 98.1 F (36.7 C) 98 F (36.7 C) 98 F (36.7 C)  TempSrc: Oral Oral Oral Oral  SpO2: 100% 100% 100% 100%  Weight:      Height:       General: alert, cooperative, and no distress Lochia: appropriate Uterine Fundus: firm Incision: N/A DVT Evaluation: No evidence of DVT seen on physical exam. Labs: Lab Results  Component Value Date   WBC 9.2 03/25/2024   HGB 9.3 (L) 03/25/2024   HCT 28.7 (L) 03/25/2024   MCV 79.5 (L) 03/25/2024   PLT 188 03/25/2024      Latest Ref Rng & Units 03/24/2024   12:21 AM  CMP  Glucose 70 - 99 mg/dL 98   BUN 6 - 20 mg/dL 8   Creatinine 1.60 - 7.37 mg/dL 1.06   Sodium 269 - 485 mmol/L 136   Potassium 3.5 - 5.1 mmol/L 3.7   Chloride 98 - 111 mmol/L 107   CO2 22 - 32 mmol/L 19   Calcium 8.9 - 10.3 mg/dL 8.6   Total Protein 6.5 - 8.1 g/dL 6.1   Total Bilirubin 0.0 - 1.2 mg/dL 0.6   Alkaline Phos 38 - 126 U/L 117   AST 15 - 41 U/L 17   ALT 0 - 44 U/L 12    Edinburgh Score:    03/24/2024    3:40 PM  Edinburgh Postnatal Depression Scale Screening Tool  I have  been able to laugh and see the funny side of things. --   No data recorded  After visit meds:  Allergies as of 03/25/2024   No Known Allergies      Medication List     TAKE these medications    ibuprofen  600 MG tablet Commonly known as: ADVIL  Take 1 tablet (600 mg total) by mouth every 6 (six) hours.   prenatal multivitamin Tabs tablet Take 1 tablet by mouth daily at 12 noon.         Discharge home in stable condition Infant Feeding: Bottle and Breast Infant Disposition:home with mother Discharge instruction: per After Visit Summary and Postpartum booklet. Activity: Advance as tolerated. Pelvic rest for 6 weeks.  Diet: routine diet Future  Appointments: Future Appointments  Date Time Provider Department Center  03/31/2024 10:30 AM [facility] NURSE [facility]  04/14/2024  3:10 PM Lacey Pian, MD [facility]   Follow up Visit:   Please schedule this patient for a In person postpartum visit in 6 weeks with the following provider: Any provider. Additional Postpartum F/U:BP check 1 week  High risk pregnancy complicated by: Beuford Bruns, Grand Multipartiy, and Anemia  Delivery mode:  Vaginal, Spontaneous Anticipated Birth Control:  None   03/25/2024 Salomon Cree, CNM

## 2024-03-25 ENCOUNTER — Encounter: Payer: Self-pay | Admitting: Obstetrics and Gynecology

## 2024-03-25 LAB — CBC
HCT: 28.7 % — ABNORMAL LOW (ref 36.0–46.0)
Hemoglobin: 9.3 g/dL — ABNORMAL LOW (ref 12.0–15.0)
MCH: 25.8 pg — ABNORMAL LOW (ref 26.0–34.0)
MCHC: 32.4 g/dL (ref 30.0–36.0)
MCV: 79.5 fL — ABNORMAL LOW (ref 80.0–100.0)
Platelets: 188 10*3/uL (ref 150–400)
RBC: 3.61 MIL/uL — ABNORMAL LOW (ref 3.87–5.11)
RDW: 14.3 % (ref 11.5–15.5)
WBC: 9.2 10*3/uL (ref 4.0–10.5)
nRBC: 0 % (ref 0.0–0.2)

## 2024-03-25 MED ORDER — IBUPROFEN 600 MG PO TABS: 600.0000 mg | ORAL_TABLET | Freq: Four times a day (QID) | ORAL | 0 refills | Status: AC

## 2024-03-28 ENCOUNTER — Other Ambulatory Visit: Payer: Self-pay | Admitting: *Deleted

## 2024-03-28 ENCOUNTER — Encounter: Payer: Self-pay | Admitting: Obstetrics and Gynecology

## 2024-03-28 MED ORDER — HYDROCORTISONE ACETATE 25 MG RE SUPP: 25.0000 mg | Freq: Two times a day (BID) | RECTAL | 1 refills | Status: AC

## 2024-03-28 MED ORDER — LIDOCAINE HCL 2 % EX GEL: 1.0000 | Freq: Two times a day (BID) | CUTANEOUS | 0 refills | Status: DC | PRN

## 2024-03-29 ENCOUNTER — Other Ambulatory Visit: Payer: Self-pay

## 2024-03-29 MED ORDER — LIDOCAINE HCL 2 % EX GEL: 1.0000 | Freq: Two times a day (BID) | CUTANEOUS | 0 refills | Status: AC | PRN

## 2024-03-30 ENCOUNTER — Encounter: Admitting: Obstetrics and Gynecology

## 2024-03-31 ENCOUNTER — Encounter

## 2024-03-31 ENCOUNTER — Telehealth: Payer: Self-pay | Admitting: *Deleted

## 2024-03-31 NOTE — Telephone Encounter (Signed)
 Left patient a message to call and reschedule missed appointment.

## 2024-04-06 ENCOUNTER — Encounter: Admitting: Obstetrics and Gynecology

## 2024-04-08 ENCOUNTER — Telehealth (HOSPITAL_COMMUNITY): Payer: Self-pay

## 2024-04-08 NOTE — Telephone Encounter (Signed)
 04/08/2024 1005  Name: Shade Rivenbark MRN: [id number] DOB: [birth year]  Reason for Call:  Transition of Care Hospital Discharge Call  Contact Status: Patient Contact Status: Message  Language assistant needed:          Follow-Up Questions:    Dimple Francis Postnatal Depression Scale:  In the Past 7 Days:    PHQ2-9 Depression Scale:     Discharge Follow-up:    Post-discharge interventions: NA  Signature  Wadell Guild

## 2024-04-14 ENCOUNTER — Telehealth: Payer: Self-pay | Admitting: *Deleted

## 2024-04-14 ENCOUNTER — Ambulatory Visit: Admitting: Obstetrics and Gynecology

## 2024-04-14 DIAGNOSIS — O133 Gestational [pregnancy-induced] hypertension without significant proteinuria, third trimester: Secondary | ICD-10-CM

## 2024-04-14 DIAGNOSIS — Z302 Encounter for sterilization: Secondary | ICD-10-CM

## 2024-04-14 NOTE — Telephone Encounter (Signed)
 Left patient a message to call and reschedule Postpartum appointment for 4 - 6 weeks, only 3 weeks. Patient did text no that she was not coming to appointment on 04/14/2024.
